# Patient Record
Sex: Female | Born: 1995 | Race: Black or African American | Hispanic: No | Marital: Single | Smoking: Never smoker
Health system: Southern US, Community
[De-identification: ages and names within clinical notes are randomized; demographics above are authoritative.]

## PROBLEM LIST (undated history)

## (undated) DIAGNOSIS — F39 Unspecified mood [affective] disorder: Secondary | ICD-10-CM

## (undated) DIAGNOSIS — D649 Anemia, unspecified: Secondary | ICD-10-CM

## (undated) HISTORY — DX: Unspecified mood (affective) disorder: F39

## (undated) HISTORY — DX: Anemia, unspecified: D64.9

## (undated) HISTORY — PX: WISDOM TOOTH EXTRACTION: SHX21

---
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
## 2013-01-01 ENCOUNTER — Ambulatory Visit: Payer: Self-pay

## 2013-01-02 ENCOUNTER — Ambulatory Visit (INDEPENDENT_AMBULATORY_CARE_PROVIDER_SITE_OTHER): Payer: Self-pay | Admitting: Family Medicine

## 2013-01-02 ENCOUNTER — Ambulatory Visit: Payer: Self-pay

## 2013-01-02 DIAGNOSIS — A749 Chlamydial infection, unspecified: Secondary | ICD-10-CM

## 2013-01-02 MED ORDER — AZITHROMYCIN 1 G PO PACK
1.0000 g | PACK | Freq: Once | ORAL | Status: AC
  Administered 2013-01-02: 1 g via ORAL

## 2013-01-02 NOTE — Progress Notes (Signed)
I was present for all necessary aspects of this encounter

## 2013-01-02 NOTE — Patient Instructions (Signed)
Pt informed to abstain from any sexual contact for the next 7 days,  to inform her partner(s) to get tested and treated and to always use condoms.Laureen Ochs, Viann Shove.

## 2013-06-26 ENCOUNTER — Telehealth: Payer: Self-pay | Admitting: *Deleted

## 2013-06-26 NOTE — Telephone Encounter (Signed)
Received a fax asking for verification of medication, exact dosage and date given for patient for treatment of positive Chlamydia or gonorrhea.  I faxed a copy of the office visit that had the date of tx, medication, route, dose and frequency, and faxed it back to [employer] [facility] department of public health.Laureen Ochs, Viann Shove

## 2013-08-04 IMAGING — US US ABDOMEN COMPLETE
1 series · 14 of 25 positions shown · non-contrast
Comparison: None.

CLINICAL DATA: Abdominal pain.  Nausea, vomiting for 3 weeks.
Epigastric pain.

COMPLETE ABDOMINAL ULTRASOUND

[Series 1: us abdomen complete · 0.24mm/px · 14 of 71 slices shown]
[im 1/71]
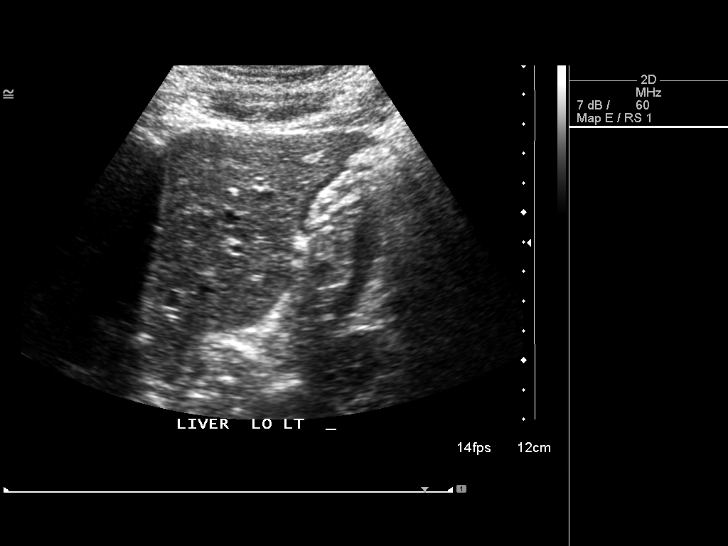
[im 6/71]
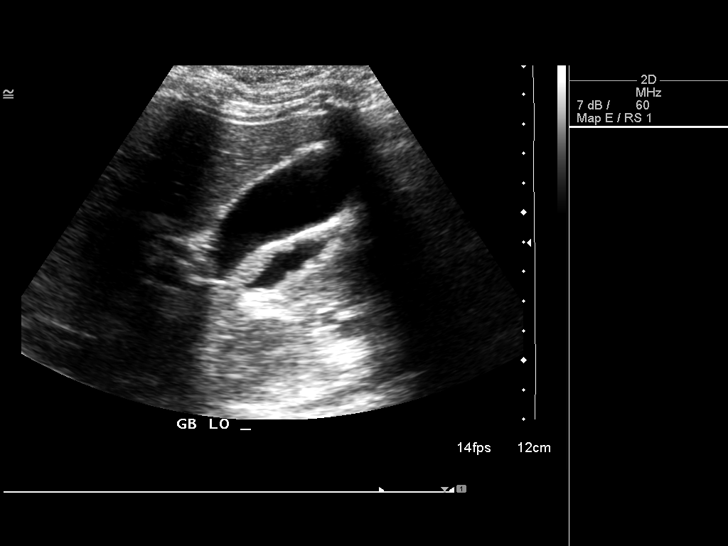
[im 12/71]
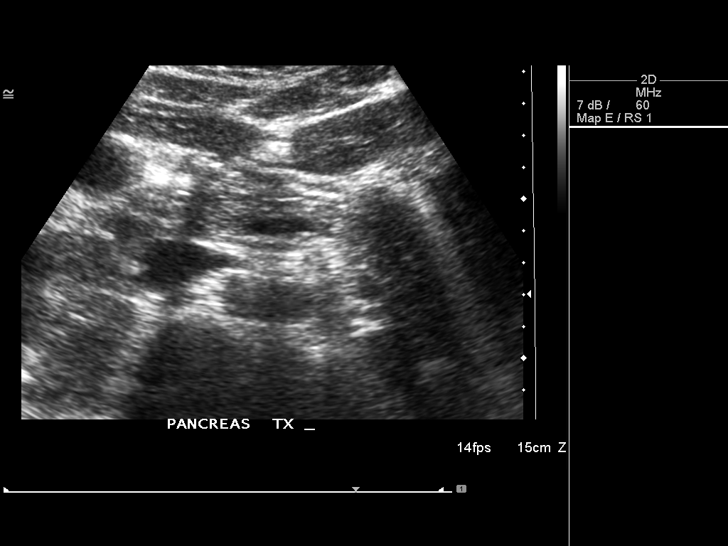
[im 18/71]
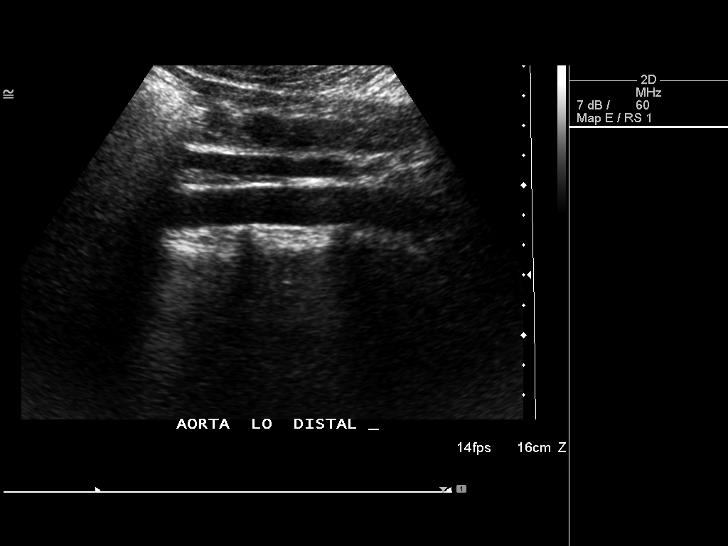
[im 24/71]
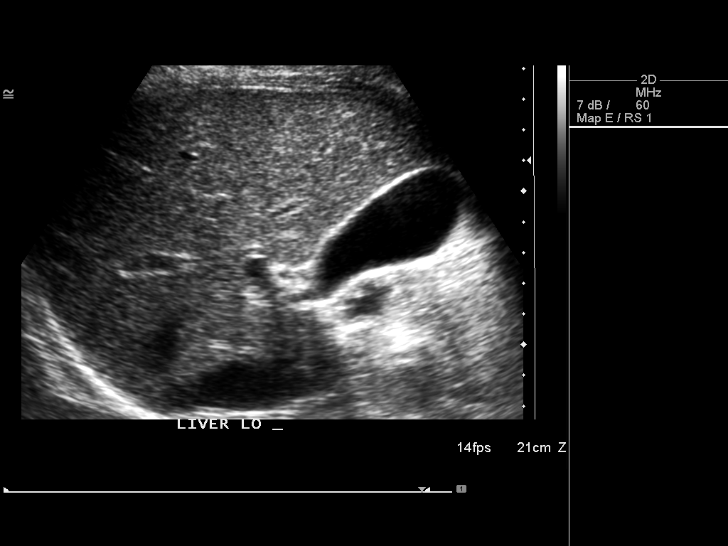
[im 27/71]
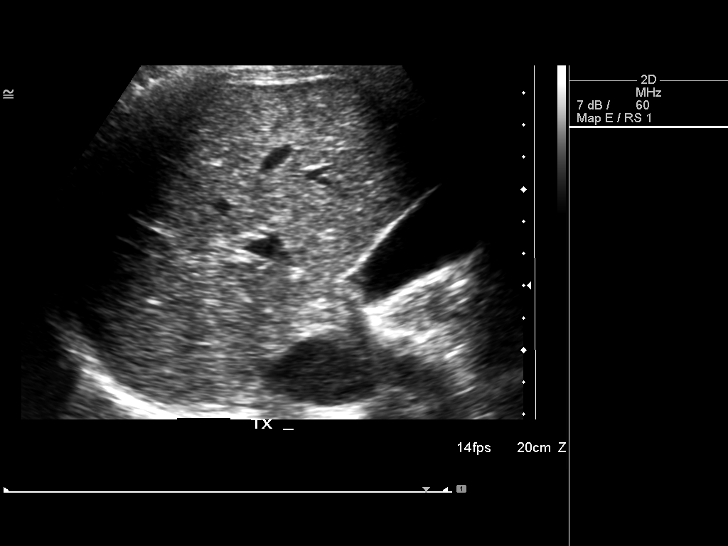
[im 33/71]
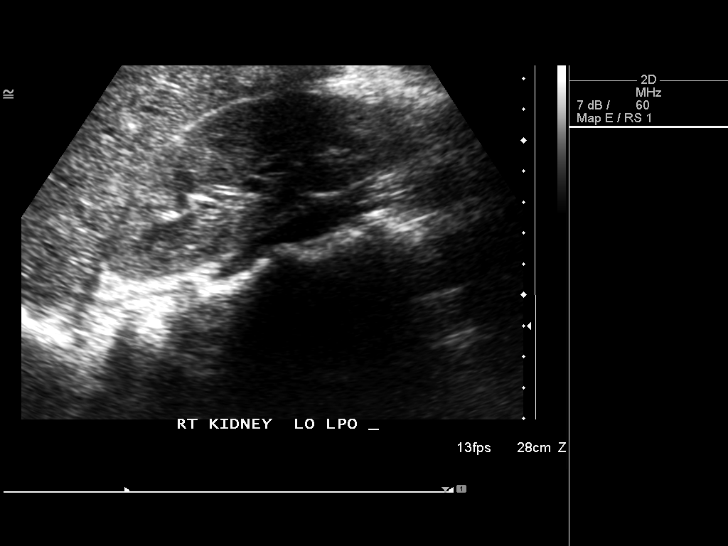
[im 38/71]
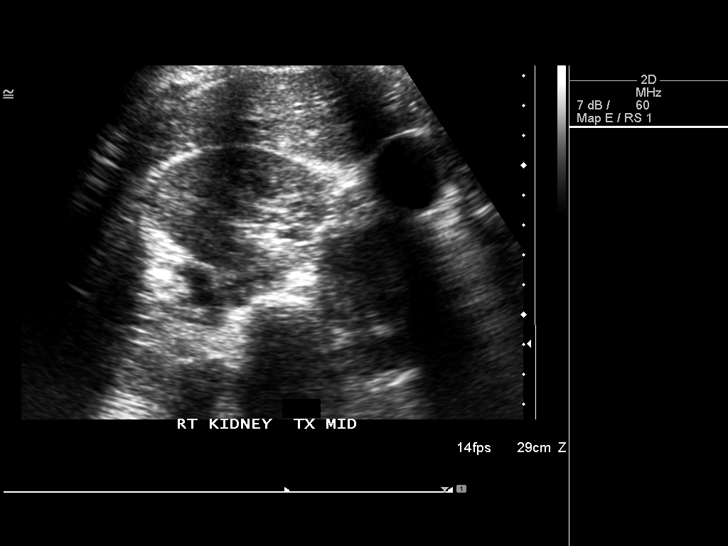
[im 44/71]
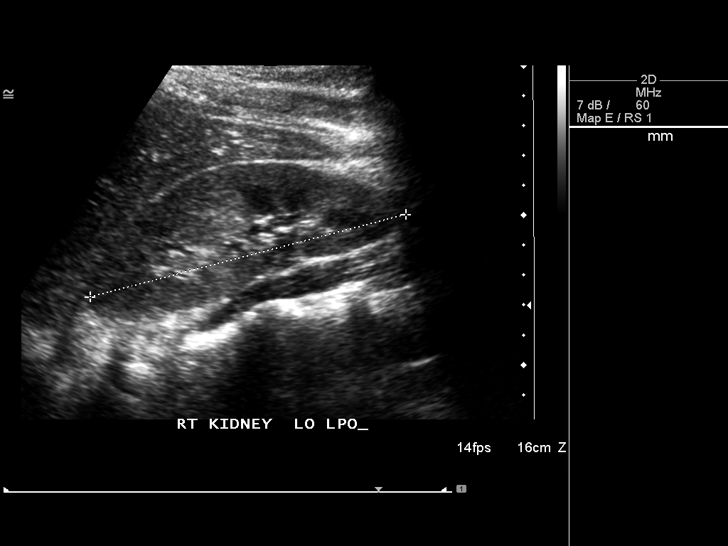
[im 47/71]
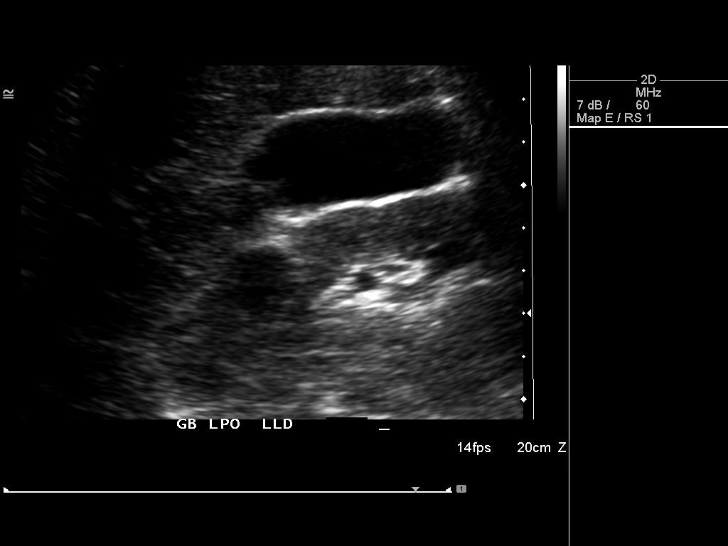
[im 53/71]
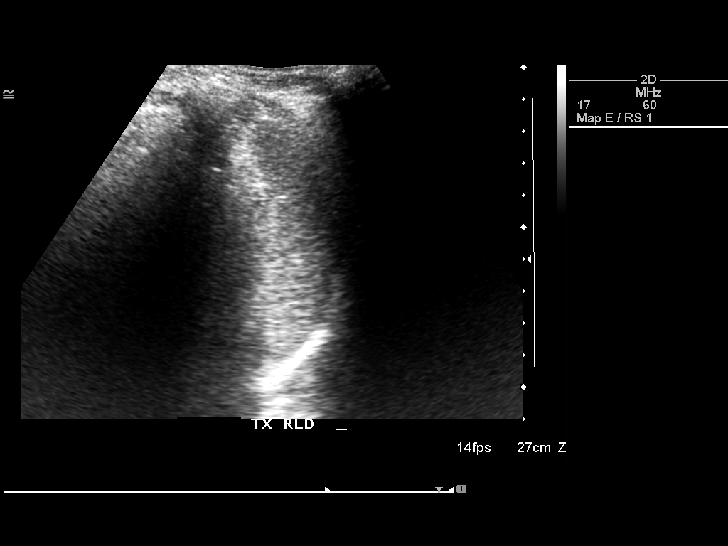
[im 59/71]
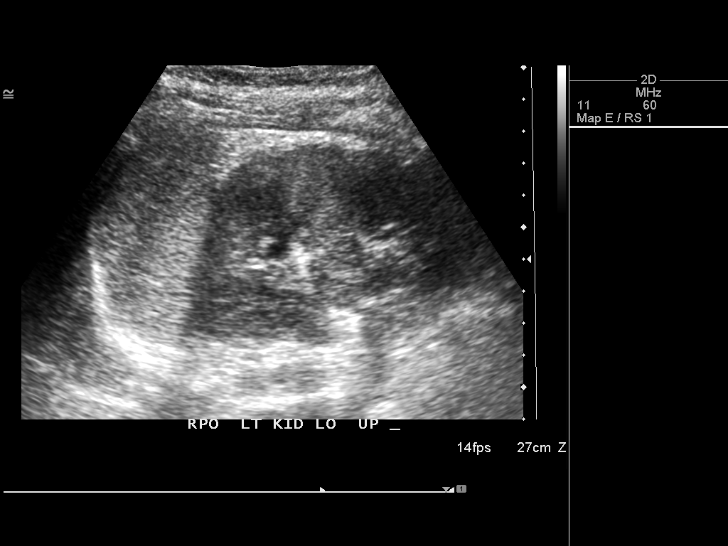
[im 65/71]
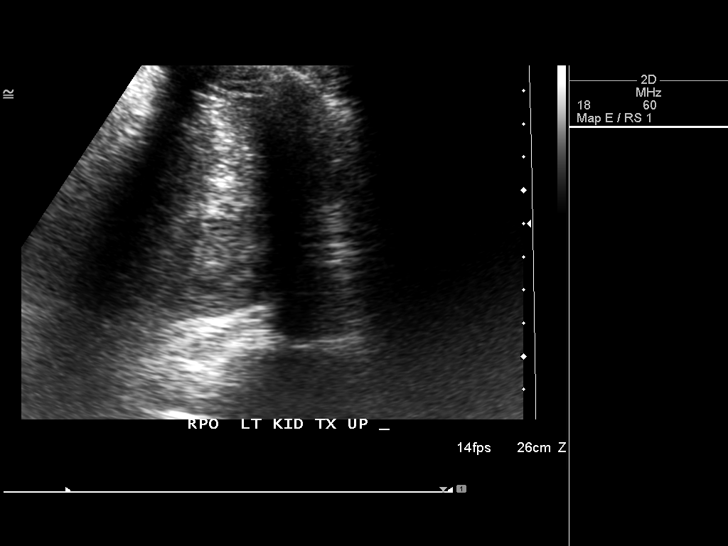
[im 71/71]
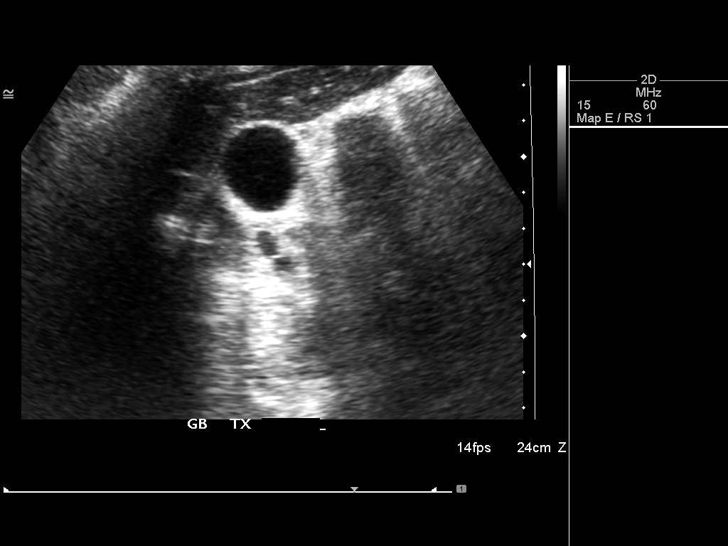

[14 of 25 positions shown; findings below may reference images not displayed]

FINDINGS: Gallbladder:  Gallbladder has a normal appearance.  Gallbladder
wall is 2.9 mm, within normal limits.  No stones or pericholecystic
fluid.  No sonographic Murphy's sign.

Common bile duct:  3.9 mm

Liver:  No focal lesion identified.  Within normal limits in
parenchymal echogenicity.

IVC:  Appears normal.

Pancreas:  Pancreas has a normal appearance.  The patient is tender
during exam of the pancreas region.

Spleen:  5.0 cm, within normal limits in appearance.

Right Kidney:  11.5 cm.  Normal appearance.

Left Kidney:  11.9 cm, normal in appearance.

Abdominal aorta:  1.8 cm.  Not aneurysmal.
IMPRESSION: 1.  No evidence for acute cholecystitis.
2.  Normal-appearing kidneys.
3.  Tenderness in the region of the pancreas during exam.

## 2014-05-31 DIAGNOSIS — Z3493 Encounter for supervision of normal pregnancy, unspecified, third trimester: Secondary | ICD-10-CM | POA: Insufficient documentation

## 2014-12-27 ENCOUNTER — Encounter: Payer: Self-pay | Admitting: Physician Assistant

## 2014-12-27 ENCOUNTER — Ambulatory Visit (INDEPENDENT_AMBULATORY_CARE_PROVIDER_SITE_OTHER): Payer: BC Managed Care – PPO | Admitting: Physician Assistant

## 2014-12-27 VITALS — BP 113/82 | HR 76 | Wt 228.0 lb

## 2014-12-27 DIAGNOSIS — R222 Localized swelling, mass and lump, trunk: Secondary | ICD-10-CM | POA: Diagnosis not present

## 2014-12-27 DIAGNOSIS — M545 Low back pain, unspecified: Secondary | ICD-10-CM

## 2014-12-27 DIAGNOSIS — R0789 Other chest pain: Secondary | ICD-10-CM

## 2014-12-27 MED ORDER — OMEPRAZOLE 40 MG PO CPDR: 40.0000 mg | DELAYED_RELEASE_CAPSULE | Freq: Every day | ORAL | Status: DC

## 2014-12-27 NOTE — Progress Notes (Signed)
   Subjective:    Patient ID: Megan Grant, female    DOB: 11-04-1995, 19 y.o.   MRN: [id number]  HPI  Pt presents to the clinic with nodule on left side of chest that is hard and occasionally will have sharp chest pains. No trigger when will happen. She does do a lot of lifting. Noticed a couple of months ago. Pain last about 5minutes and then resolves on its own. Not tried anything to make better. No cough, wheezing. Not with exertion. No arm pain, numbness or tingling.   She does have some ongoing low back pain without radiation. No saddle anthesthesia. She has tried ibuprofen with no help. She has never had evaluated. She just had baby 7 months ago. No known injury.     Review of Systems  All other systems reviewed and are negative.      Objective:   Physical Exam  Constitutional: She is oriented to person, place, and time. She appears well-developed and well-nourished.  HENT:  Head: Normocephalic and atraumatic.  Cardiovascular: Normal rate, regular rhythm and normal heart sounds.   Pulmonary/Chest: Effort normal and breath sounds normal. She has no wheezes.    Musculoskeletal:  No lumbar spinal tenderness.  Normal ROM at waist.  Negative straight leg test.  Mild tightness up left lower back.   Neurological: She is alert and oriented to person, place, and time.  Skin: Skin is dry.  Psychiatric: She has a normal mood and affect. Her behavior is normal.          Assessment & Plan:  Atypical CP/bony chest promienence- EKG NSR, no acute changes, no ST elevation or depression, no arrhthymias. Reassured patient does not seem cardiac. I feel same nodule on right side of chest. Will get CXR to evaluate. It seems the episodic pains could be musculoskelatal. Use pennsaid samples bid as needed over left chest.   Bilateral back pain without sciatica- pt has never had imaging. Will get lumbar xray.  Seems like she has some muscle strain with her job and possible some SI joint  dysfunction. Given exercises to start. Suggested 800mg  of ibuprofen regularly for next week or so then as needed. Discussed epson salt baths, cool compresses. If continues come in for full evaluation.

## 2014-12-27 NOTE — Patient Instructions (Signed)
Low Back Sprain with Rehab  A sprain is an injury in which a ligament is torn. The ligaments of the lower back are vulnerable to sprains. However, they are strong and require great force to be injured. These ligaments are important for stabilizing the spinal column. Sprains are classified into three categories. Grade 1 sprains cause pain, but the tendon is not lengthened. Grade 2 sprains include a lengthened ligament, due to the ligament being stretched or partially ruptured. With grade 2 sprains there is still function, although the function may be decreased. Grade 3 sprains involve a complete tear of the tendon or muscle, and function is usually impaired. SYMPTOMS   Severe pain in the lower back.  Sometimes, a feeling of a "pop," "snap," or tear, at the time of injury.  Tenderness and sometimes swelling at the injury site.  Uncommonly, bruising (contusion) within 48 hours of injury.  Muscle spasms in the back. CAUSES  Low back sprains occur when a force is placed on the ligaments that is greater than they can handle. Common causes of injury include:  Performing a stressful act while off-balance.  Repetitive stressful activities that involve movement of the lower back.  Direct hit (trauma) to the lower back. RISK INCREASES WITH:  Contact sports (football, wrestling).  Collisions (major skiing accidents).  Sports that require throwing or lifting (baseball, weightlifting).  Sports involving twisting of the spine (gymnastics, diving, tennis, golf).  Poor strength and flexibility.  Inadequate protection.  Previous back injury or surgery (especially fusion). PREVENTION  Wear properly fitted and padded protective equipment.  Warm up and stretch properly before activity.  Allow for adequate recovery between workouts.  Maintain physical fitness:  Strength, flexibility, and endurance.  Cardiovascular fitness.  Maintain a healthy body weight. PROGNOSIS  If treated  properly, low back sprains usually heal with non-surgical treatment. The length of time for healing depends on the severity of the injury.  RELATED COMPLICATIONS   Recurring symptoms, resulting in a chronic problem.  Chronic inflammation and pain in the low back.  Delayed healing or resolution of symptoms, especially if activity is resumed too soon.  Prolonged impairment.  Unstable or arthritic joints of the low back. TREATMENT  Treatment first involves the use of ice and medicine, to reduce pain and inflammation. The use of strengthening and stretching exercises may help reduce pain with activity. These exercises may be performed at home or with a therapist. Severe injuries may require referral to a therapist for further evaluation and treatment, such as ultrasound. Your caregiver may advise that you wear a back brace or corset, to help reduce pain and discomfort. Often, prolonged bed rest results in greater harm then benefit. Corticosteroid injections may be recommended. However, these should be reserved for the most serious cases. It is important to avoid using your back when lifting objects. At night, sleep on your back on a firm mattress, with a pillow placed under your knees. If non-surgical treatment is unsuccessful, surgery may be needed.  MEDICATION   If pain medicine is needed, nonsteroidal anti-inflammatory medicines (aspirin and ibuprofen), or other minor pain relievers (acetaminophen), are often advised.  Do not take pain medicine for 7 days before surgery.  Prescription pain relievers may be given, if your caregiver thinks they are needed. Use only as directed and only as much as you need.  Ointments applied to the skin may be helpful.  Corticosteroid injections may be given by your caregiver. These injections should be reserved for the most serious cases,   because they may only be given a certain number of times. HEAT AND COLD  Cold treatment (icing) should be applied for 10  to 15 minutes every 2 to 3 hours for inflammation and pain, and immediately after activity that aggravates your symptoms. Use ice packs or an ice massage.  Heat treatment may be used before performing stretching and strengthening activities prescribed by your caregiver, physical therapist, or athletic trainer. Use a heat pack or a warm water soak. SEEK MEDICAL CARE IF:   Symptoms get worse or do not improve in 2 to 4 weeks, despite treatment.  You develop numbness or weakness in either leg.  You lose bowel or bladder function.  Any of the following occur after surgery: fever, increased pain, swelling, redness, drainage of fluids, or bleeding in the affected area.  New, unexplained symptoms develop. (Drugs used in treatment may produce side effects.) EXERCISES  RANGE OF MOTION (ROM) AND STRETCHING EXERCISES - Low Back Sprain Most people with lower back pain will find that their symptoms get worse with excessive bending forward (flexion) or arching at the lower back (extension). The exercises that will help resolve your symptoms will focus on the opposite motion.  Your physician, physical therapist or athletic trainer will help you determine which exercises will be most helpful to resolve your lower back pain. Do not complete any exercises without first consulting with your caregiver. Discontinue any exercises which make your symptoms worse, until you speak to your caregiver. If you have pain, numbness or tingling which travels down into your buttocks, leg or foot, the goal of the therapy is for these symptoms to move closer to your back and eventually resolve. Sometimes, these leg symptoms will get better, but your lower back pain may worsen. This is often an indication of progress in your rehabilitation. Be very alert to any changes in your symptoms and the activities in which you participated in the 24 hours prior to the change. Sharing this information with your caregiver will allow him or her to  most efficiently treat your condition. These exercises may help you when beginning to rehabilitate your injury. Your symptoms may resolve with or without further involvement from your physician, physical therapist or athletic trainer. While completing these exercises, remember:   Restoring tissue flexibility helps normal motion to return to the joints. This allows healthier, less painful movement and activity.  An effective stretch should be held for at least 30 seconds.  A stretch should never be painful. You should only feel a gentle lengthening or release in the stretched tissue. FLEXION RANGE OF MOTION AND STRETCHING EXERCISES: STRETCH - Flexion, Single Knee to Chest   Lie on a firm bed or floor with both legs extended in front of you.  Keeping one leg in contact with the floor, bring your opposite knee to your chest. Hold your leg in place by either grabbing behind your thigh or at your knee.  Pull until you feel a gentle stretch in your low back. Hold __________ seconds.  Slowly release your grasp and repeat the exercise with the opposite side. Repeat __________ times. Complete this exercise __________ times per day.  STRETCH - Flexion, Double Knee to Chest  Lie on a firm bed or floor with both legs extended in front of you.  Keeping one leg in contact with the floor, bring your opposite knee to your chest.  Tense your stomach muscles to support your back and then lift your other knee to your chest. Hold your legs   in place by either grabbing behind your thighs or at your knees.  Pull both knees toward your chest until you feel a gentle stretch in your low back. Hold __________ seconds.  Tense your stomach muscles and slowly return one leg at a time to the floor. Repeat __________ times. Complete this exercise __________ times per day.  STRETCH - Low Trunk Rotation  Lie on a firm bed or floor. Keeping your legs in front of you, bend your knees so they are both pointed toward the  ceiling and your feet are flat on the floor.  Extend your arms out to the side. This will stabilize your upper body by keeping your shoulders in contact with the floor.  Gently and slowly drop both knees together to one side until you feel a gentle stretch in your low back. Hold for __________ seconds.  Tense your stomach muscles to support your lower back as you bring your knees back to the starting position. Repeat the exercise to the other side. Repeat __________ times. Complete this exercise __________ times per day  EXTENSION RANGE OF MOTION AND FLEXIBILITY EXERCISES: STRETCH - Extension, Prone on Elbows   Lie on your stomach on the floor, a bed will be too soft. Place your palms about shoulder width apart and at the height of your head.  Place your elbows under your shoulders. If this is too painful, stack pillows under your chest.  Allow your body to relax so that your hips drop lower and make contact more completely with the floor.  Hold this position for __________ seconds.  Slowly return to lying flat on the floor. Repeat __________ times. Complete this exercise __________ times per day.  RANGE OF MOTION - Extension, Prone Press Ups  Lie on your stomach on the floor, a bed will be too soft. Place your palms about shoulder width apart and at the height of your head.  Keeping your back as relaxed as possible, slowly straighten your elbows while keeping your hips on the floor. You may adjust the placement of your hands to maximize your comfort. As you gain motion, your hands will come more underneath your shoulders.  Hold this position __________ seconds.  Slowly return to lying flat on the floor. Repeat __________ times. Complete this exercise __________ times per day.  RANGE OF MOTION- Quadruped, Neutral Spine   Assume a hands and knees position on a firm surface. Keep your hands under your shoulders and your knees under your hips. You may place padding under your knees for  comfort.  Drop your head and point your tailbone toward the ground below you. This will round out your lower back like an angry cat. Hold this position for __________ seconds.  Slowly lift your head and release your tail bone so that your back sags into a large arch, like an old horse.  Hold this position for __________ seconds.  Repeat this until you feel limber in your low back.  Now, find your "sweet spot." This will be the most comfortable position somewhere between the two previous positions. This is your neutral spine. Once you have found this position, tense your stomach muscles to support your low back.  Hold this position for __________ seconds. Repeat __________ times. Complete this exercise __________ times per day.  STRENGTHENING EXERCISES - Low Back Sprain These exercises may help you when beginning to rehabilitate your injury. These exercises should be done near your "sweet spot." This is the neutral, low-back arch, somewhere between fully rounded   and fully arched, that is your least painful position. When performed in this safe range of motion, these exercises can be used for people who have either a flexion or extension based injury. These exercises may resolve your symptoms with or without further involvement from your physician, physical therapist or athletic trainer. While completing these exercises, remember:   Muscles can gain both the endurance and the strength needed for everyday activities through controlled exercises.  Complete these exercises as instructed by your physician, physical therapist or athletic trainer. Increase the resistance and repetitions only as guided.  You may experience muscle soreness or fatigue, but the pain or discomfort you are trying to eliminate should never worsen during these exercises. If this pain does worsen, stop and make certain you are following the directions exactly. If the pain is still present after adjustments, discontinue the  exercise until you can discuss the trouble with your caregiver. STRENGTHENING - Deep Abdominals, Pelvic Tilt   Lie on a firm bed or floor. Keeping your legs in front of you, bend your knees so they are both pointed toward the ceiling and your feet are flat on the floor.  Tense your lower abdominal muscles to press your low back into the floor. This motion will rotate your pelvis so that your tail bone is scooping upwards rather than pointing at your feet or into the floor. With a gentle tension and even breathing, hold this position for __________ seconds. Repeat __________ times. Complete this exercise __________ times per day.  STRENGTHENING - Abdominals, Crunches   Lie on a firm bed or floor. Keeping your legs in front of you, bend your knees so they are both pointed toward the ceiling and your feet are flat on the floor. Cross your arms over your chest.  Slightly tip your chin down without bending your neck.  Tense your abdominals and slowly lift your trunk high enough to just clear your shoulder blades. Lifting higher can put excessive stress on the lower back and does not further strengthen your abdominal muscles.  Control your return to the starting position. Repeat __________ times. Complete this exercise __________ times per day.  STRENGTHENING - Quadruped, Opposite UE/LE Lift   Assume a hands and knees position on a firm surface. Keep your hands under your shoulders and your knees under your hips. You may place padding under your knees for comfort.  Find your neutral spine and gently tense your abdominal muscles so that you can maintain this position. Your shoulders and hips should form a rectangle that is parallel with the floor and is not twisted.  Keeping your trunk steady, lift your right hand no higher than your shoulder and then your left leg no higher than your hip. Make sure you are not holding your breath. Hold this position for __________ seconds.  Continuing to keep  your abdominal muscles tense and your back steady, slowly return to your starting position. Repeat with the opposite arm and leg. Repeat __________ times. Complete this exercise __________ times per day.  STRENGTHENING - Abdominals and Quadriceps, Straight Leg Raise   Lie on a firm bed or floor with both legs extended in front of you.  Keeping one leg in contact with the floor, bend the other knee so that your foot can rest flat on the floor.  Find your neutral spine, and tense your abdominal muscles to maintain your spinal position throughout the exercise.  Slowly lift your straight leg off the floor about 6 inches for a count   of 15, making sure to not hold your breath.  Still keeping your neutral spine, slowly lower your leg all the way to the floor. Repeat this exercise with each leg __________ times. Complete this exercise __________ times per day. POSTURE AND BODY MECHANICS CONSIDERATIONS - Low Back Sprain Keeping correct posture when sitting, standing or completing your activities will reduce the stress put on different body tissues, allowing injured tissues a chance to heal and limiting painful experiences. The following are general guidelines for improved posture. Your physician or physical therapist will provide you with any instructions specific to your needs. While reading these guidelines, remember:  The exercises prescribed by your provider will help you have the flexibility and strength to maintain correct postures.  The correct posture provides the best environment for your joints to work. All of your joints have less wear and tear when properly supported by a spine with good posture. This means you will experience a healthier, less painful body.  Correct posture must be practiced with all of your activities, especially prolonged sitting and standing. Correct posture is as important when doing repetitive low-stress activities (typing) as it is when doing a single heavy-load  activity (lifting). RESTING POSITIONS Consider which positions are most painful for you when choosing a resting position. If you have pain with flexion-based activities (sitting, bending, stooping, squatting), choose a position that allows you to rest in a less flexed posture. You would want to avoid curling into a fetal position on your side. If your pain worsens with extension-based activities (prolonged standing, working overhead), avoid resting in an extended position such as sleeping on your stomach. Most people will find more comfort when they rest with their spine in a more neutral position, neither too rounded nor too arched. Lying on a non-sagging bed on your side with a pillow between your knees, or on your back with a pillow under your knees will often provide some relief. Keep in mind, being in any one position for a prolonged period of time, no matter how correct your posture, can still lead to stiffness. PROPER SITTING POSTURE In order to minimize stress and discomfort on your spine, you must sit with correct posture. Sitting with good posture should be effortless for a healthy body. Returning to good posture is a gradual process. Many people can work toward this most comfortably by using various supports until they have the flexibility and strength to maintain this posture on their own. When sitting with proper posture, your ears will fall over your shoulders and your shoulders will fall over your hips. You should use the back of the chair to support your upper back. Your lower back will be in a neutral position, just slightly arched. You may place a small pillow or folded towel at the base of your lower back for  support.  When working at a desk, create an environment that supports good, upright posture. Without extra support, muscles tire, which leads to excessive strain on joints and other tissues. Keep these recommendations in mind: CHAIR:  A chair should be able to slide under your desk  when your back makes contact with the back of the chair. This allows you to work closely.  The chair's height should allow your eyes to be level with the upper part of your monitor and your hands to be slightly lower than your elbows. BODY POSITION  Your feet should make contact with the floor. If this is not possible, use a foot rest.  Keep your   ears over your shoulders. This will reduce stress on your neck and low back. INCORRECT SITTING POSTURES  If you are feeling tired and unable to assume a healthy sitting posture, do not slouch or slump. This puts excessive strain on your back tissues, causing more damage and pain. Healthier options include:  Using more support, like a lumbar pillow.  Switching tasks to something that requires you to be upright or walking.  Talking a brief walk.  Lying down to rest in a neutral-spine position. PROLONGED STANDING WHILE SLIGHTLY LEANING FORWARD  When completing a task that requires you to lean forward while standing in one place for a long time, place either foot up on a stationary 2-4 inch high object to help maintain the best posture. When both feet are on the ground, the lower back tends to lose its slight inward curve. If this curve flattens (or becomes too large), then the back and your other joints will experience too much stress, tire more quickly, and can cause pain. CORRECT STANDING POSTURES Proper standing posture should be assumed with all daily activities, even if they only take a few moments, like when brushing your teeth. As in sitting, your ears should fall over your shoulders and your shoulders should fall over your hips. You should keep a slight tension in your abdominal muscles to brace your spine. Your tailbone should point down to the ground, not behind your body, resulting in an over-extended swayback posture.  INCORRECT STANDING POSTURES  Common incorrect standing postures include a forward head, locked knees and/or an excessive  swayback. WALKING Walk with an upright posture. Your ears, shoulders and hips should all line-up. PROLONGED ACTIVITY IN A FLEXED POSITION When completing a task that requires you to bend forward at your waist or lean over a low surface, try to find a way to stabilize 3 out of 4 of your limbs. You can place a hand or elbow on your thigh or rest a knee on the surface you are reaching across. This will provide you more stability, so that your muscles do not tire as quickly. By keeping your knees relaxed, or slightly bent, you will also reduce stress across your lower back. CORRECT LIFTING TECHNIQUES DO :  Assume a wide stance. This will provide you more stability and the opportunity to get as close as possible to the object which you are lifting.  Tense your abdominals to brace your spine. Bend at the knees and hips. Keeping your back locked in a neutral-spine position, lift using your leg muscles. Lift with your legs, keeping your back straight.  Test the weight of unknown objects before attempting to lift them.  Try to keep your elbows locked down at your sides in order get the best strength from your shoulders when carrying an object.  Always ask for help when lifting heavy or awkward objects. INCORRECT LIFTING TECHNIQUES DO NOT:   Lock your knees when lifting, even if it is a small object.  Bend and twist. Pivot at your feet or move your feet when needing to change directions.  Assume that you can safely pick up even a paperclip without proper posture. Document Released: 09/06/2005 Document Revised: 11/29/2011 Document Reviewed: 12/19/2008 [employer] Patient Information 2015 [employer]. This information is not intended to replace advice given to you by your health care provider. Make sure you discuss any questions you have with your health care provider.  

## 2014-12-31 ENCOUNTER — Encounter: Payer: Self-pay | Admitting: Physician Assistant

## 2014-12-31 DIAGNOSIS — M545 Low back pain, unspecified: Secondary | ICD-10-CM | POA: Insufficient documentation

## 2014-12-31 DIAGNOSIS — R0789 Other chest pain: Secondary | ICD-10-CM | POA: Insufficient documentation

## 2014-12-31 DIAGNOSIS — R222 Localized swelling, mass and lump, trunk: Secondary | ICD-10-CM | POA: Insufficient documentation

## 2014-12-31 MED ORDER — DICLOFENAC SODIUM 2 % TD SOLN: TRANSDERMAL | Status: DC

## 2015-01-15 ENCOUNTER — Telehealth: Payer: Self-pay | Admitting: Family Medicine

## 2015-01-15 NOTE — Telephone Encounter (Signed)
Unable to leave message, no voicemail.

## 2015-01-15 NOTE — Telephone Encounter (Signed)
Patient's mom called and advised her daughter has been trying to call and speak with a nurse in regard to her back pain and she has not been able to get a hold of anyone. Pt is still having back pain and not getting better would like to know her next step and if she can see Dr. Karie Schwalbe or be referred to a specialist. Patient request a call back from a nurse.Thanks

## 2015-01-15 NOTE — Telephone Encounter (Signed)
Tried to call pt and/or mom to adv pt needs to go get her xray regarding her back pain so we can see what the next best step would be and unable to leave vm @ [phone] and mother emergency contact number is invalid called 01/15/15 @ 1:15pm

## 2015-01-15 NOTE — Telephone Encounter (Signed)
She needs to go for the xray that was ordered and we can go from there,

## 2015-01-16 ENCOUNTER — Ambulatory Visit (INDEPENDENT_AMBULATORY_CARE_PROVIDER_SITE_OTHER): Payer: BC Managed Care – PPO

## 2015-01-16 DIAGNOSIS — M545 Low back pain, unspecified: Secondary | ICD-10-CM

## 2015-01-16 DIAGNOSIS — R0789 Other chest pain: Secondary | ICD-10-CM

## 2015-01-17 NOTE — Telephone Encounter (Signed)
Lets try to call one more time. If not then can mail letter.

## 2015-01-20 ENCOUNTER — Institutional Professional Consult (permissible substitution): Payer: Self-pay | Admitting: Sports Medicine

## 2015-01-20 NOTE — Telephone Encounter (Signed)
Spoke with Pt, advised of results. Pt has set up appt with Dr.T. No further questions.

## 2015-01-24 ENCOUNTER — Institutional Professional Consult (permissible substitution): Payer: Self-pay | Admitting: Sports Medicine

## 2015-08-25 ENCOUNTER — Ambulatory Visit (INDEPENDENT_AMBULATORY_CARE_PROVIDER_SITE_OTHER): Payer: BC Managed Care – PPO | Admitting: Family Medicine

## 2015-08-25 ENCOUNTER — Encounter: Payer: Self-pay | Admitting: Family Medicine

## 2015-08-25 VITALS — BP 117/75 | HR 75 | Temp 98.8°F | Resp 16 | Wt 240.0 lb

## 2015-08-25 DIAGNOSIS — Z3009 Encounter for other general counseling and advice on contraception: Secondary | ICD-10-CM

## 2015-08-25 NOTE — Patient Instructions (Signed)
Etonogestrel implant What is this medicine? ETONOGESTREL (et oh noe JES trel) is a contraceptive (birth control) device. It is used to prevent pregnancy. It can be used for up to 3 years. This medicine may be used for other purposes; ask your health care provider or pharmacist if you have questions. What should I tell my health care provider before I take this medicine? They need to know if you have any of these conditions: -abnormal vaginal bleeding -blood vessel disease or blood clots -cancer of the breast, cervix, or liver -depression -diabetes -gallbladder disease -headaches -heart disease or recent heart attack -high blood pressure -high cholesterol -kidney disease -liver disease -renal disease -seizures -tobacco smoker -an unusual or allergic reaction to etonogestrel, other hormones, anesthetics or antiseptics, medicines, foods, dyes, or preservatives -pregnant or trying to get pregnant -breast-feeding How should I use this medicine? This device is inserted just under the skin on the inner side of your upper arm by a health care professional. Talk to your pediatrician regarding the use of this medicine in children. Special care may be needed. Overdosage: If you think you have taken too much of this medicine contact a poison control center or emergency room at once. NOTE: This medicine is only for you. Do not share this medicine with others. What if I miss a dose? This does not apply. What may interact with this medicine? Do not take this medicine with any of the following medications: -amprenavir -bosentan -fosamprenavir This medicine may also interact with the following medications: -barbiturate medicines for inducing sleep or treating seizures -certain medicines for fungal infections like ketoconazole and itraconazole -griseofulvin -medicines to treat seizures like carbamazepine, felbamate, oxcarbazepine, phenytoin,  topiramate -modafinil -phenylbutazone -rifampin -some medicines to treat HIV infection like atazanavir, indinavir, lopinavir, nelfinavir, tipranavir, ritonavir -St. John's wort This list may not describe all possible interactions. Give your health care provider a list of all the medicines, herbs, non-prescription drugs, or dietary supplements you use. Also tell them if you smoke, drink alcohol, or use illegal drugs. Some items may interact with your medicine. What should I watch for while using this medicine? This product does not protect you against HIV infection (AIDS) or other sexually transmitted diseases. You should be able to feel the implant by pressing your fingertips over the skin where it was inserted. Contact your doctor if you cannot feel the implant, and use a non-hormonal birth control method (such as condoms) until your doctor confirms that the implant is in place. If you feel that the implant may have broken or become bent while in your arm, contact your healthcare provider. What side effects may I notice from receiving this medicine? Side effects that you should report to your doctor or health care professional as soon as possible: -allergic reactions like skin rash, itching or hives, swelling of the face, lips, or tongue -breast lumps -changes in emotions or moods -depressed mood -heavy or prolonged menstrual bleeding -pain, irritation, swelling, or bruising at the insertion site -scar at site of insertion -signs of infection at the insertion site such as fever, and skin redness, pain or discharge -signs of pregnancy -signs and symptoms of a blood clot such as breathing problems; changes in vision; chest pain; severe, sudden headache; pain, swelling, warmth in the leg; trouble speaking; sudden numbness or weakness of the face, arm or leg -signs and symptoms of liver injury like dark yellow or brown urine; general ill feeling or flu-like symptoms; light-colored stools; loss of  appetite; nausea; right upper belly   pain; unusually weak or tired; yellowing of the eyes or skin -unusual vaginal bleeding, discharge -signs and symptoms of a stroke like changes in vision; confusion; trouble speaking or understanding; severe headaches; sudden numbness or weakness of the face, arm or leg; trouble walking; dizziness; loss of balance or coordination Side effects that usually do not require medical attention (Report these to your doctor or health care professional if they continue or are bothersome.): -acne -back pain -breast pain -changes in weight -dizziness -general ill feeling or flu-like symptoms -headache -irregular menstrual bleeding -nausea -sore throat -vaginal irritation or inflammation This list may not describe all possible side effects. Call your doctor for medical advice about side effects. You may report side effects to FDA at 1-800-FDA-1088. Where should I keep my medicine? This drug is given in a hospital or clinic and will not be stored at home. NOTE: This sheet is a summary. It may not cover all possible information. If you have questions about this medicine, talk to your doctor, pharmacist, or health care provider.    2016, Elsevier/Gold Standard. (2014-06-21 14:07:06)  

## 2015-08-25 NOTE — Progress Notes (Signed)
   Subjective:    Patient ID: Megan Grant, female    DOB: 10-Jun-1996, 19 y.o.   MRN: [id number]  HPI Patient is here today to discuss birth control. She said she's been on and off the birth control pill since high school. But says last year to she's had difficulty remembering to take the pill consistently. Unfortunately she recently had an abortion. She is most interested in that Nexplanon. She has a few friends who actually habit and have spoken with them. She says she's also done some research online about it. She is not currently interested in an IUD.   Review of Systems     Objective:   Physical Exam  Constitutional: She is oriented to person, place, and time. She appears well-developed and well-nourished.  HENT:  Head: Normocephalic and atraumatic.  Eyes: Conjunctivae and EOM are normal.  Cardiovascular: Normal rate.   Pulmonary/Chest: Effort normal.  Neurological: She is alert and oriented to person, place, and time.  Skin: Skin is dry. No pallor.  Psychiatric: She has a normal mood and affect. Her behavior is normal.  Vitals reviewed.         Assessment & Plan:  Contraceptove counseling-discussed Nexplanon and other options. [facility] Recommend that she remain abstinent until she is able to have the device placed. Her questions and give her some additional information today.

## 2015-08-27 ENCOUNTER — Encounter: Payer: Self-pay | Admitting: Obstetrics & Gynecology

## 2015-08-27 ENCOUNTER — Ambulatory Visit (INDEPENDENT_AMBULATORY_CARE_PROVIDER_SITE_OTHER): Payer: BC Managed Care – PPO | Admitting: Obstetrics & Gynecology

## 2015-08-27 VITALS — BP 130/90 | HR 81 | Resp 16 | Ht 69.0 in | Wt 240.0 lb

## 2015-08-27 DIAGNOSIS — Z3201 Encounter for pregnancy test, result positive: Secondary | ICD-10-CM | POA: Diagnosis not present

## 2015-08-27 DIAGNOSIS — Z30019 Encounter for initial prescription of contraceptives, unspecified: Secondary | ICD-10-CM | POA: Diagnosis not present

## 2015-08-27 DIAGNOSIS — Z01812 Encounter for preprocedural laboratory examination: Secondary | ICD-10-CM

## 2015-08-27 LAB — POCT URINE PREGNANCY: PREG TEST UR: POSITIVE — AB

## 2015-08-27 NOTE — Progress Notes (Signed)
   Subjective:    Patient ID: Megan Grant, female    DOB: [birth year], 19 y.o.   MRN: [id number]  HPI  19 yo S AA P1 (21 yo son) here today for Nexplanon insertion. She had an abortion 08/09/15 at what she says was 8 weeks although her LMP was in August. She reports that her periods are regular. She denies sex since the abortion. She was given OCPs after the procedure but she didn't fill the prescription.  Review of Systems     Objective:   Physical Exam [id number] Breathing, conversing, and ambulating normally       Assessment & Plan:  Desire for Nexplanon but + HCG. I will get a QBHCG today and in 48 hours. If it is decreasing, then she can get the Nexplanon Monday. I stressed abstinence until Nexplanon in and working.

## 2015-08-28 LAB — HCG, QUANTITATIVE, PREGNANCY: hCG, Beta Chain, Quant, S: 168.5 m[IU]/mL — ABNORMAL HIGH

## 2015-08-29 ENCOUNTER — Other Ambulatory Visit (INDEPENDENT_AMBULATORY_CARE_PROVIDER_SITE_OTHER): Payer: BC Managed Care – PPO

## 2015-08-29 DIAGNOSIS — O3680X1 Pregnancy with inconclusive fetal viability, fetus 1: Secondary | ICD-10-CM | POA: Diagnosis not present

## 2015-08-29 DIAGNOSIS — Z01812 Encounter for preprocedural laboratory examination: Secondary | ICD-10-CM

## 2015-08-30 LAB — HCG, QUANTITATIVE, PREGNANCY: hCG, Beta Chain, Quant, S: 98.6 m[IU]/mL — ABNORMAL HIGH

## 2015-09-03 ENCOUNTER — Encounter: Payer: Self-pay | Admitting: Obstetrics & Gynecology

## 2015-09-03 ENCOUNTER — Ambulatory Visit (INDEPENDENT_AMBULATORY_CARE_PROVIDER_SITE_OTHER): Payer: BC Managed Care – PPO | Admitting: Obstetrics & Gynecology

## 2015-09-03 VITALS — BP 118/72 | HR 84 | Resp 16 | Ht 69.0 in | Wt 240.0 lb

## 2015-09-03 DIAGNOSIS — Z30017 Encounter for initial prescription of implantable subdermal contraceptive: Secondary | ICD-10-CM

## 2015-09-03 DIAGNOSIS — Z30013 Encounter for initial prescription of injectable contraceptive: Secondary | ICD-10-CM

## 2015-09-03 DIAGNOSIS — Z01812 Encounter for preprocedural laboratory examination: Secondary | ICD-10-CM

## 2015-09-03 MED ORDER — ETONOGESTREL 68 MG ~~LOC~~ IMPL
68.0000 mg | DRUG_IMPLANT | Freq: Once | SUBCUTANEOUS | Status: AC
  Administered 2015-09-03: 68 mg via SUBCUTANEOUS

## 2015-09-03 NOTE — Progress Notes (Signed)
   Subjective:    Patient ID: Megan Grant, female    DOB: 24-Feb-1996, 19 y.o.   MRN: [id number]  HPI  19 yo S AA P0 is here for Nexplanon insertion. Her QBHCG is declining (after an abortion).   Review of Systems     Objective:   Physical Exam  UPT was negative. Consent was signed. Time out procedure was done. Her left arm was prepped with betadine and infiltrated with 3 cc of 1% lidocaine. After adequate anesthesia was assured, the Nexplanon device was placed according to standard of care. Her arm was hemostatic and was bandaged. She tolerated the procedure well.       Assessment & Plan:  Contraception- Nexplanon Rec back up method for 2 weeks

## 2015-09-03 NOTE — Addendum Note (Signed)
Addended by: Arne ClevelandHUTCHINSON, MANDY J on: 09/03/2015 09:11 AM   Modules accepted: Orders

## 2015-11-13 ENCOUNTER — Ambulatory Visit (INDEPENDENT_AMBULATORY_CARE_PROVIDER_SITE_OTHER): Payer: BC Managed Care – PPO | Admitting: Family Medicine

## 2015-11-13 ENCOUNTER — Encounter: Payer: Self-pay | Admitting: Family Medicine

## 2015-11-13 VITALS — BP 125/81 | HR 88 | Ht 69.0 in | Wt 233.0 lb

## 2015-11-13 DIAGNOSIS — B379 Candidiasis, unspecified: Secondary | ICD-10-CM | POA: Diagnosis not present

## 2015-11-13 DIAGNOSIS — L298 Other pruritus: Secondary | ICD-10-CM

## 2015-11-13 DIAGNOSIS — N898 Other specified noninflammatory disorders of vagina: Secondary | ICD-10-CM

## 2015-11-13 LAB — WET PREP FOR TRICH, YEAST, CLUE
TRICH WET PREP: NONE SEEN
YEAST WET PREP: NONE SEEN

## 2015-11-13 MED ORDER — FLUCONAZOLE 150 MG PO TABS
150.0000 mg | ORAL_TABLET | Freq: Once | ORAL | Status: DC
Start: 2015-11-13 — End: 2016-08-09

## 2015-11-13 NOTE — Assessment & Plan Note (Signed)
Likely due to recent amoxicillin use.  Tx fluconazole.  Self swab wet prep pending.

## 2015-11-13 NOTE — Progress Notes (Signed)
       Megan Grant is a 20 y.o. female who presents to [facility]: Primary Care today for vaginal yeast infection. Patient notes a 4 day history of vaginal discharge and irritation. This is consistent with prior history of vaginal yeast infection. She notes that she recently completed a course of Amoxicillin. She denies any urinary frequency, urgency or dysuria. No fever, chills or NVD.    No past medical history on file. Past Surgical History  Procedure Laterality Date  . Wisdom tooth extraction     Social History  Substance Use Topics  . Smoking status: Never Smoker   . Smokeless tobacco: Never Used  . Alcohol Use: 0.0 oz/week    0 Standard drinks or equivalent per week     Comment: occassional   family history includes Diabetes in her father; Hypertension in her mother; Stroke in her father.  ROS as above Medications: Current Outpatient Prescriptions  Medication Sig Dispense Refill  . fluconazole (DIFLUCAN) 150 MG tablet Take 1 tablet (150 mg total) by mouth once. 1 tablet 1   No current facility-administered medications for this visit.   No Known Allergies   Exam:  BP 125/81 mmHg  Pulse 88  Ht  (1.753 m)  Wt 233 lb (105.688 kg)  BMI 34.39 kg/m2 Gen: Well NAD, non-toxic appearing Patient deferred exam.   No results found for this or any previous visit (from the past 24 hour(s)). No results found.   Please see individual assessment and plan sections.

## 2015-11-13 NOTE — Patient Instructions (Signed)
Thank you for coming in today. Monilial Vaginitis Vaginitis in a soreness, swelling and redness (inflammation) of the vagina and vulva. Monilial vaginitis is not a sexually transmitted infection. CAUSES  Yeast vaginitis is caused by yeast (candida) that is normally found in your vagina. With a yeast infection, the candida has overgrown in number to a point that upsets the chemical balance. SYMPTOMS   White, thick vaginal discharge.  Swelling, itching, redness and irritation of the vagina and possibly the lips of the vagina (vulva).  Burning or painful urination.  Painful intercourse. DIAGNOSIS  Things that may contribute to monilial vaginitis are:  Postmenopausal and virginal states.  Pregnancy.  Infections.  Being tired, sick or stressed, especially if you had monilial vaginitis in the past.  Diabetes. Good control will help lower the chance.  Birth control pills.  Tight fitting garments.  Using bubble bath, feminine sprays, douches or deodorant tampons.  Taking certain medications that kill germs (antibiotics).  Sporadic recurrence can occur if you become ill. TREATMENT  Your caregiver will give you medication.  There are several kinds of anti monilial vaginal creams and suppositories specific for monilial vaginitis. For recurrent yeast infections, use a suppository or cream in the vagina 2 times a week, or as directed.  Anti-monilial or steroid cream for the itching or irritation of the vulva may also be used. Get your caregiver's permission.  Painting the vagina with methylene blue solution may help if the monilial cream does not work.  Eating yogurt may help prevent monilial vaginitis. HOME CARE INSTRUCTIONS   Finish all medication as prescribed.  Do not have sex until treatment is completed or after your caregiver tells you it is okay.  Take warm sitz baths.  Do not douche.  Do not use tampons, especially scented ones.  Wear cotton underwear.  Avoid  tight pants and panty hose.  Tell your sexual partner that you have a yeast infection. They should go to their caregiver if they have symptoms such as mild rash or itching.  Your sexual partner should be treated as well if your infection is difficult to eliminate.  Practice safer sex. Use condoms.  Some vaginal medications cause latex condoms to fail. Vaginal medications that harm condoms are:  Cleocin cream.  Butoconazole (Femstat).  Terconazole (Terazol) vaginal suppository.  Miconazole (Monistat) (may be purchased over the counter). SEEK MEDICAL CARE IF:   You have a temperature by mouth above 102 F (38.9 C).  The infection is getting worse after 2 days of treatment.  The infection is not getting better after 3 days of treatment.  You develop blisters in or around your vagina.  You develop vaginal bleeding, and it is not your menstrual period.  You have pain when you urinate.  You develop intestinal problems.  You have pain with sexual intercourse.   This information is not intended to replace advice given to you by your health care provider. Make sure you discuss any questions you have with your health care provider.   Document Released: 06/16/2005 Document Revised: 11/29/2011 Document Reviewed: 03/10/2015 Elsevier Interactive Patient Education [employer]

## 2015-11-14 MED ORDER — METRONIDAZOLE 500 MG PO TABS: 500.0000 mg | ORAL_TABLET | Freq: Two times a day (BID) | ORAL | Status: DC

## 2015-11-14 NOTE — Progress Notes (Signed)
Quick Note:  Labs do not show yeast. They do show evidence of BV. Take flagyl twice daily for 1 week. Do not drink alcohol with this medicine as it will make you sick. ______

## 2015-11-14 NOTE — Addendum Note (Signed)
Addended by: Rodolph Bong on: 11/14/2015 07:42 AM   Modules accepted: Orders

## 2016-02-27 IMAGING — CR DG CHEST 2V
2 series · 2 of 2 positions shown · non-contrast
Comparison: None.

CLINICAL DATA: Intermittent mid and left-sided chest pain, also
history of intermittently palpable nodule on the left side of the
chest. The PET patient was unable localize this nodule today for
radiographic marker placement.

EXAM:
CHEST  2 VIEW

[chest pa]
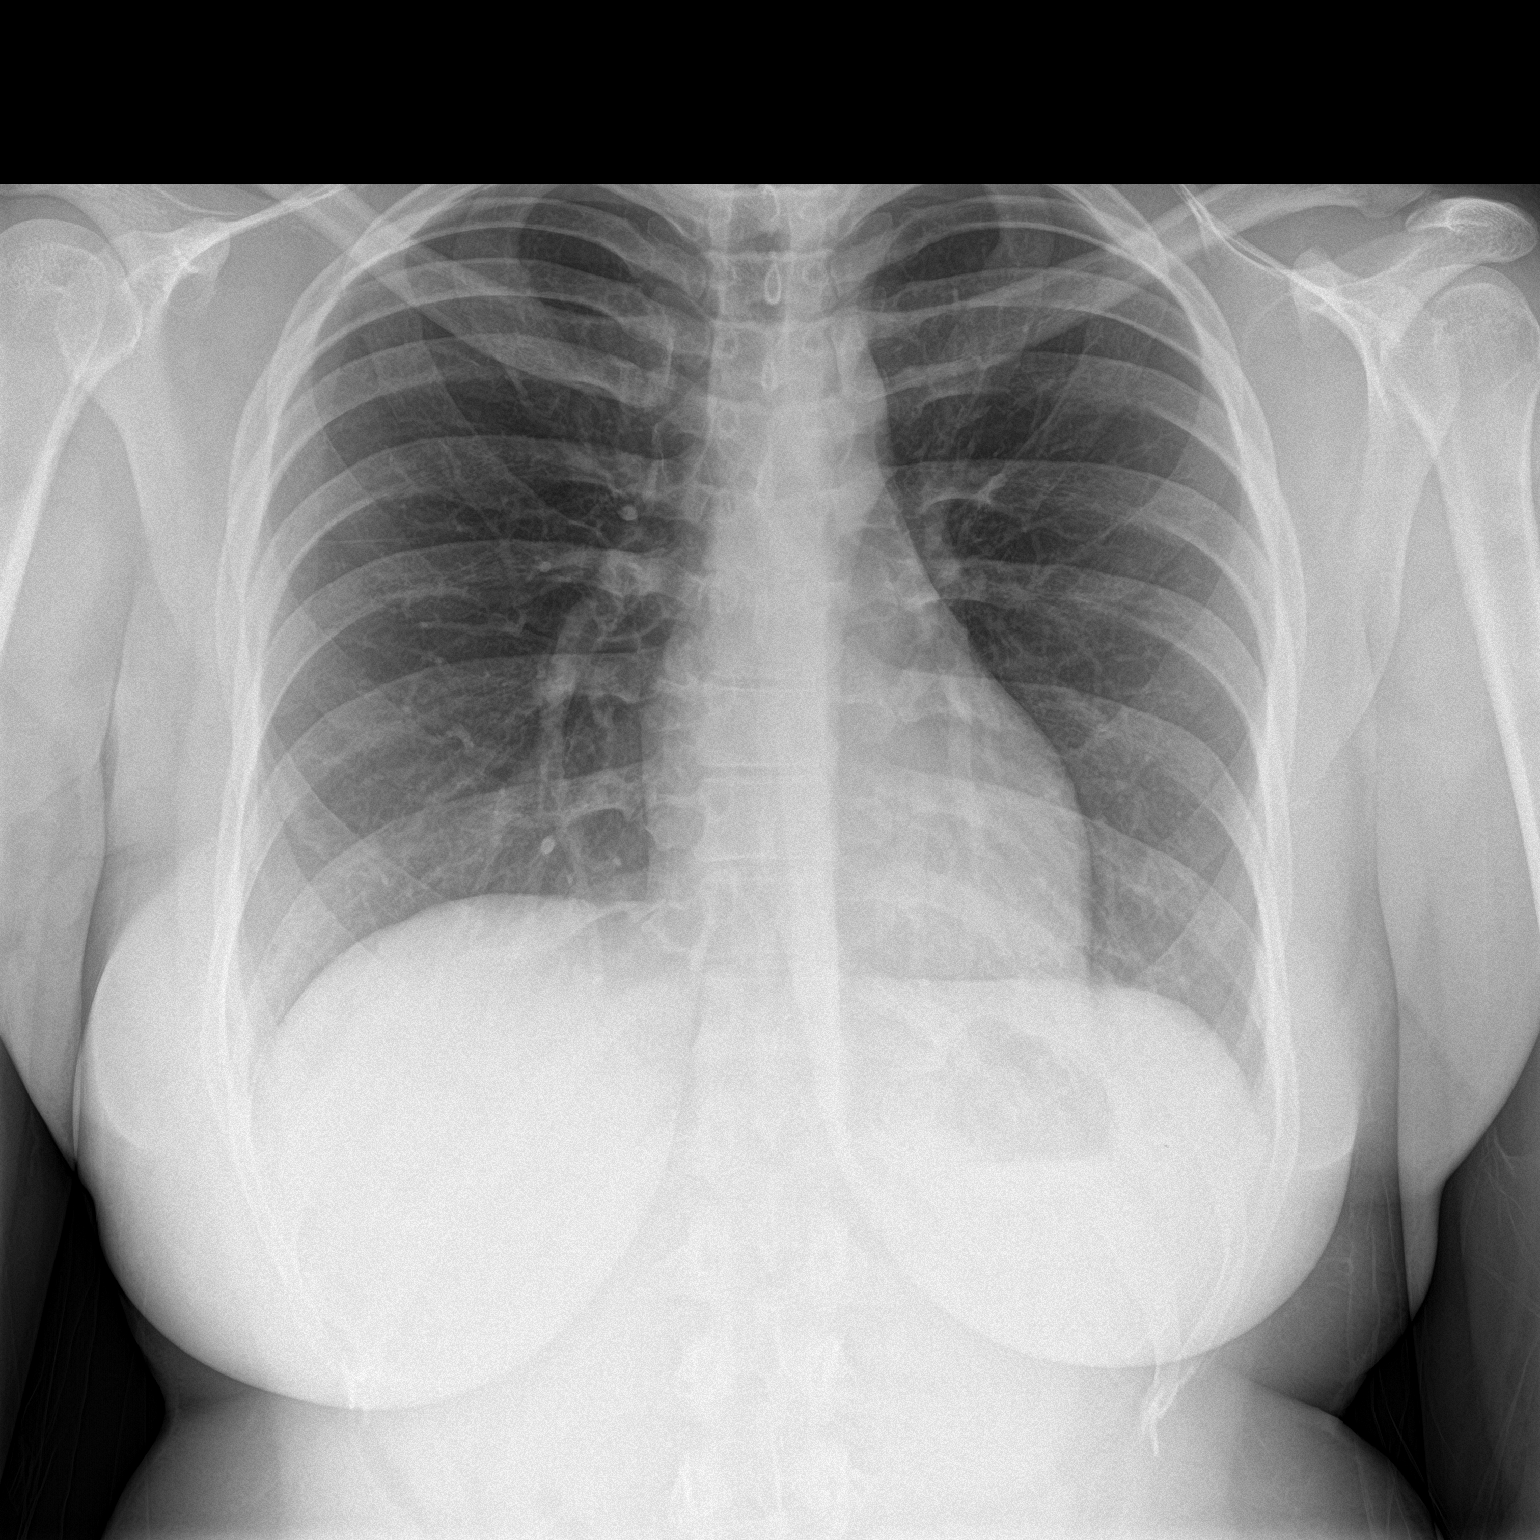

[chest lat]
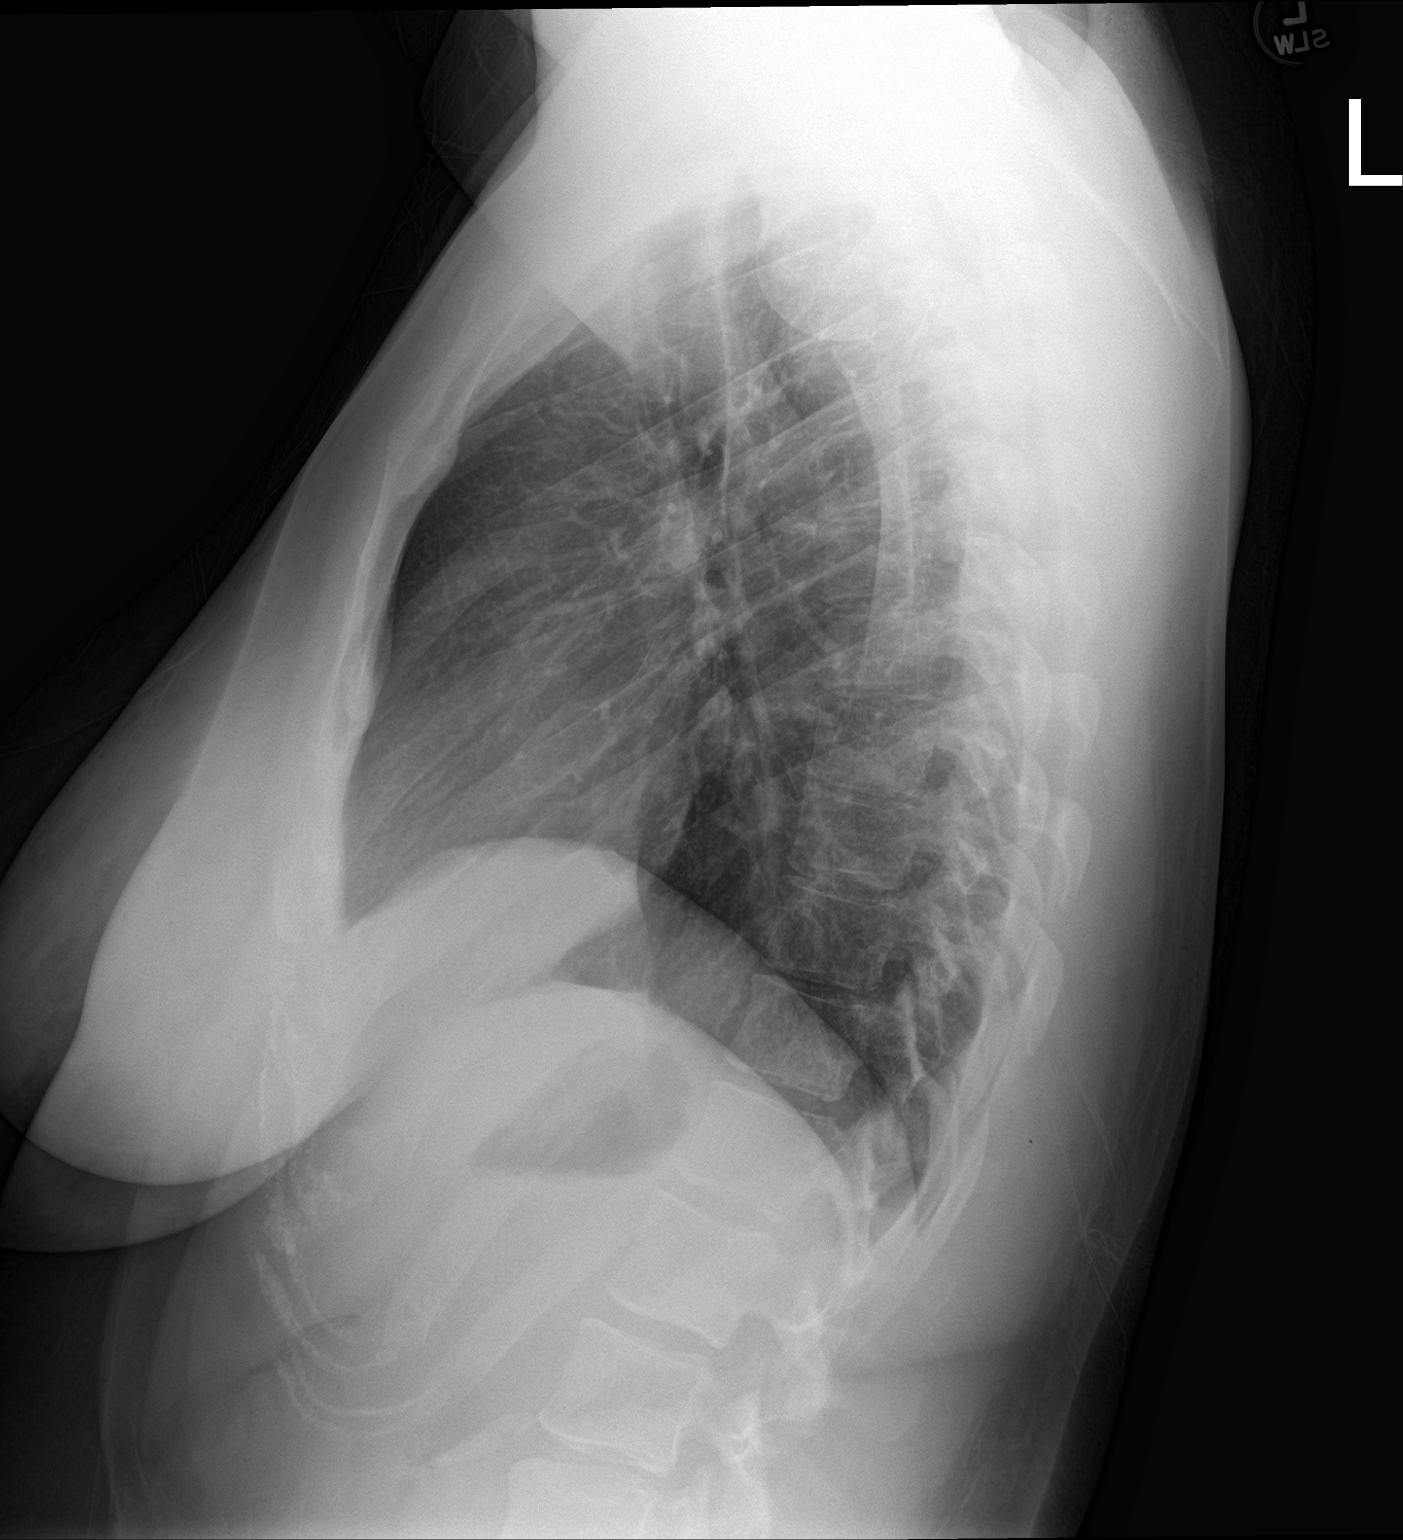

[2 of 2 positions shown; findings below may reference images not displayed]

FINDINGS: The lungs are adequately inflated and clear. The heart and pulmonary
vascularity are normal. The mediastinum is normal in width. There is
no pleural effusion. The bony thorax is unremarkable.
IMPRESSION: There is no active cardiopulmonary disease.

## 2016-08-04 ENCOUNTER — Ambulatory Visit: Payer: BC Managed Care – PPO | Admitting: Family Medicine

## 2016-08-09 ENCOUNTER — Ambulatory Visit: Payer: BC Managed Care – PPO | Admitting: Family Medicine

## 2016-08-09 NOTE — Progress Notes (Deleted)
CC: cough   Subjective:    Patient ID: Megan Grant, female    DOB: 1996-02-20, 20 y.o.   MRN: [id number]  HPI    Review of Systems     Objective:   Physical Exam  Constitutional: She is oriented to person, place, and time. She appears well-developed and well-nourished.  HENT:  Head: Normocephalic and atraumatic.  Right Ear: External ear normal.  Left Ear: External ear normal.  Nose: Nose normal.  Mouth/Throat: Oropharynx is clear and moist.  TMs and canals are clear.   Eyes: Conjunctivae and EOM are normal. Pupils are equal, round, and reactive to light.  Neck: Neck supple. No thyromegaly present.  Cardiovascular: Normal rate, regular rhythm and normal heart sounds.   Pulmonary/Chest: Effort normal and breath sounds normal. She has no wheezes.  Lymphadenopathy:    She has no cervical adenopathy.  Neurological: She is alert and oriented to person, place, and time.  Skin: Skin is warm and dry.  Psychiatric: She has a normal mood and affect.          Assessment & Plan:  Cough

## 2016-08-10 ENCOUNTER — Encounter: Payer: Self-pay | Admitting: Family Medicine

## 2016-08-10 ENCOUNTER — Ambulatory Visit (INDEPENDENT_AMBULATORY_CARE_PROVIDER_SITE_OTHER): Payer: BC Managed Care – PPO | Admitting: Family Medicine

## 2016-08-10 VITALS — BP 120/88 | HR 89 | Temp 98.0°F | Wt 238.0 lb

## 2016-08-10 DIAGNOSIS — R05 Cough: Secondary | ICD-10-CM

## 2016-08-10 DIAGNOSIS — R059 Cough, unspecified: Secondary | ICD-10-CM

## 2016-08-10 MED ORDER — BENZONATATE 200 MG PO CAPS: 200.0000 mg | ORAL_CAPSULE | Freq: Three times a day (TID) | ORAL | 0 refills | Status: DC | PRN

## 2016-08-10 MED ORDER — AZITHROMYCIN 250 MG PO TABS: 250.0000 mg | ORAL_TABLET | Freq: Every day | ORAL | 0 refills | Status: DC

## 2016-08-10 MED ORDER — GUAIFENESIN-CODEINE 100-10 MG/5ML PO SOLN: 5.0000 mL | Freq: Every evening | ORAL | 1 refills | Status: DC | PRN

## 2016-08-10 NOTE — Patient Instructions (Signed)
Thank you for coming in today. Call or go to the emergency room if you get worse, have trouble breathing, have chest pains, or palpitations.  Use tessalon during the day for cough. Use codeine over the counter as needed for cough.  Call or go to the emergency room if you get worse, have trouble breathing, have chest pains, or palpitations.  Use azithromycin antibiotic if not better or if worsening.     Acute Bronchitis, Adult Acute bronchitis is when air tubes (bronchi) in the lungs suddenly get swollen. The condition can make it hard to breathe. It can also cause these symptoms:  A cough.  Coughing up clear, yellow, or green mucus.  Wheezing.  Chest congestion.  Shortness of breath.  A fever.  Body aches.  Chills.  A sore throat. Follow these instructions at home: Medicines  Take over-the-counter and prescription medicines only as told by your doctor.  If you were prescribed an antibiotic medicine, take it as told by your doctor. Do not stop taking the antibiotic even if you start to feel better. General instructions  Rest.  Drink enough fluids to keep your pee (urine) clear or pale yellow.  Avoid smoking and secondhand smoke. If you smoke and you need help quitting, ask your doctor. Quitting will help your lungs heal faster.  Use an inhaler, cool mist vaporizer, or humidifier as told by your doctor.  Keep all follow-up visits as told by your doctor. This is important. How is this prevented? To lower your risk of getting this condition again:  Wash your hands often with soap and water. If you cannot use soap and water, use hand sanitizer.  Avoid contact with people who have cold symptoms.  Try not to touch your hands to your mouth, nose, or eyes.  Make sure to get the flu shot every year. Contact a doctor if:  Your symptoms do not get better in 2 weeks. Get help right away if:  You cough up blood.  You have chest pain.  You have very bad shortness of  breath.  You become dehydrated.  You faint (pass out) or keep feeling like you are going to pass out.  You keep throwing up (vomiting).  You have a very bad headache.  Your fever or chills gets worse. This information is not intended to replace advice given to you by your health care provider. Make sure you discuss any questions you have with your health care provider. Document Released: 02/23/2008 Document Revised: 04/14/2016 Document Reviewed: 02/25/2016 Elsevier Interactive Patient Education  2017 [employer]

## 2016-08-10 NOTE — Progress Notes (Signed)
       Mikki SanteeBianca M Ghosh is a 20 y.o. female [facility] today for cough. Patient has a 3 week history of cough. Cough is nonproductive and not associated with significant wheezing or shortness of breath or chest pain. No fevers chills vomiting or diarrhea. Cough is bothersome at night and does interfere with sleep. She has tried some over-the-counter cough medicines which did not help very much.   No past medical history on file. Past Surgical History:  Procedure Laterality Date  . WISDOM TOOTH EXTRACTION     Social History  Substance Use Topics  . Smoking status: Never Smoker  . Smokeless tobacco: Never Used  . Alcohol use 0.0 oz/week     Comment: occassional   family history includes Diabetes in her father; Hypertension in her mother; Stroke in her father.  ROS as above:  Medications: Current Outpatient Prescriptions  Medication Sig Dispense Refill  . azithromycin (ZITHROMAX) 250 MG tablet Take 1 tablet (250 mg total) by mouth daily. Take first 2 tablets together, then 1 every day until finished. 6 tablet 0  . benzonatate (TESSALON) 200 MG capsule Take 1 capsule (200 mg total) by mouth 3 (three) times daily as needed for cough. 45 capsule 0  . guaiFENesin-codeine 100-10 MG/5ML syrup Take 5 mLs by mouth at bedtime as needed for cough. 120 mL 1   No current facility-administered medications for this visit.    No Known Allergies  Health Maintenance Health Maintenance  Topic Date Due  . TETANUS/TDAP  04/19/2013  . CHLAMYDIA SCREENING  12/26/2013  . PAP SMEAR  04/20/2015  . INFLUENZA VACCINE  08/24/2016 (Originally 04/20/2016)  . HIV Screening  Completed     Exam:  BP 120/88   Pulse 89   Temp 98 F (36.7 C) (Oral)   Wt 238 lb (108 kg)   BMI 35.15 kg/m  Gen: Well NAD HEENT: EOMI,  MMM Posterior pharynx with mild cobblestoning. Lungs: Normal  work of breathing. CTABL Heart: RRR no MRG Abd: NABS, Soft. Nondistended, Nontender Exts: Brisk capillary refill, warm and well perfused.    No results found for this or any previous visit (from the past 72 hour(s)). No results found.    Assessment and Plan: 20 y.o. female with viral cough likely bronchitis. Symptomatic management with Tessalon Perles and codeine cough syrup. Use backup antibiotic if not better.  Patient will follow-up with PCP in the near future to address health maintenance items including Pap smear Chlamydia screening and Tdap vaccine.   No orders of the defined types were placed in this encounter.   Discussed warning signs or symptoms. Please see discharge instructions. Patient expresses understanding.

## 2016-08-30 ENCOUNTER — Ambulatory Visit (INDEPENDENT_AMBULATORY_CARE_PROVIDER_SITE_OTHER): Payer: BC Managed Care – PPO | Admitting: Family Medicine

## 2016-08-30 ENCOUNTER — Other Ambulatory Visit (HOSPITAL_COMMUNITY)
Admission: RE | Admit: 2016-08-30 | Discharge: 2016-08-30 | Disposition: A | Discharge status: Home / Self Care | Payer: BC Managed Care – PPO | Source: Ambulatory Visit | Attending: Family Medicine | Admitting: Family Medicine

## 2016-08-30 ENCOUNTER — Encounter: Payer: Self-pay | Admitting: Family Medicine

## 2016-08-30 VITALS — BP 126/84 | HR 75 | Ht 69.0 in | Wt 241.0 lb

## 2016-08-30 DIAGNOSIS — D649 Anemia, unspecified: Secondary | ICD-10-CM | POA: Diagnosis not present

## 2016-08-30 DIAGNOSIS — Z23 Encounter for immunization: Secondary | ICD-10-CM

## 2016-08-30 DIAGNOSIS — Z01419 Encounter for gynecological examination (general) (routine) without abnormal findings: Secondary | ICD-10-CM | POA: Insufficient documentation

## 2016-08-30 DIAGNOSIS — Z113 Encounter for screening for infections with a predominantly sexual mode of transmission: Secondary | ICD-10-CM | POA: Diagnosis present

## 2016-08-30 DIAGNOSIS — Z124 Encounter for screening for malignant neoplasm of cervix: Secondary | ICD-10-CM

## 2016-08-30 DIAGNOSIS — Z0000 Encounter for general adult medical examination without abnormal findings: Secondary | ICD-10-CM

## 2016-08-30 NOTE — Progress Notes (Signed)
Subjective:     Megan Grant is a 20 y.o. female and is here for a comprehensive physical exam. The patient reports no problems.  Social History   Social History  . Marital status: Single    Spouse name: N/A  . Number of children: N/A  . Years of education: N/A   Occupational History  . Student/Work     [employer]   Social History Main Topics  . Smoking status: Never Smoker  . Smokeless tobacco: Never Used  . Alcohol use 0.0 oz/week     Comment: occassional  . Drug use:     Frequency: 7.0 times per week    Types: Marijuana  . Sexual activity: Yes    Partners: Male   Other Topics Concern  . Not on file   Social History Narrative   No regular exercise.    Health Maintenance  Topic Date Due  . TETANUS/TDAP  04/19/2013  . CHLAMYDIA SCREENING  12/26/2013  . PAP SMEAR  04/20/2015  . INFLUENZA VACCINE  12/19/2018 (Originally 04/20/2016)  . HIV Screening  Completed    The following portions of the patient's history were reviewed and updated as appropriate: allergies, current medications, past family history, past medical history, past social history, past surgical history and problem list.  Review of Systems A comprehensive review of systems was negative.   Objective:    BP 126/84   Pulse 75   Ht 5\' 9"  (1.753 m)   Wt 241 lb (109.3 kg)   LMP 08/30/2016   SpO2 100%   BMI 35.59 kg/m  General appearance: alert, cooperative and appears stated age Head: Normocephalic, without obvious abnormality, atraumatic Eyes: conj clear, EOMi, PEERLA Ears: normal TM's and external ear canals both ears Nose: Nares normal. Septum midline. Mucosa normal. No drainage or sinus tenderness. Throat: lips, mucosa, and tongue normal; teeth and gums normal Neck: no adenopathy, no carotid bruit, no JVD, supple, symmetrical, trachea midline and thyroid not enlarged, symmetric, no tenderness/mass/nodules Back: symmetric, no curvature. ROM normal. No CVA tenderness. Lungs: clear to  auscultation bilaterally Breasts: normal appearance, no masses or tenderness Heart: regular rate and rhythm, S1, S2 normal, no murmur, click, rub or gallop Abdomen: soft, non-tender; bowel sounds normal; no masses,  no organomegaly Pelvic: cervix normal in appearance, external genitalia normal, no adnexal masses or tenderness, no cervical motion tenderness, rectovaginal septum normal, uterus normal size, shape, and consistency and vagina normal without discharge.  Piercing of clitoris.   Extremities: extremities normal, atraumatic, no cyanosis or edema Pulses: 2+ and symmetric Skin: Skin color, texture, turgor normal. No rashes or lesions Lymph nodes: Cervical, supraclavicular, and axillary nodes normal. Neurologic: Alert and oriented X 3, normal strength and tone. Normal symmetric reflexes. Normal coordination and gait    Assessment:    Healthy female exam.      Plan:  CPE   See After Visit Summary for Counseling Recommendations    Keep up a regular exercise program and make sure you are eating a healthy diet Try to eat 4 servings of dairy a day, or if you are lactose intolerant take a calcium with vitamin D daily.  Your vaccines are up to date.

## 2016-08-30 NOTE — Patient Instructions (Signed)
Keep up a regular exercise program and make sure you are eating a healthy diet Try to eat 4 servings of dairy a day, or if you are lactose intolerant take a calcium with vitamin D daily.  Your vaccines are up to date.   

## 2016-09-02 LAB — CYTOLOGY - PAP
CHLAMYDIA, DNA PROBE: NEGATIVE
Diagnosis: NEGATIVE
Neisseria Gonorrhea: NEGATIVE

## 2016-09-05 NOTE — Progress Notes (Signed)
Correction.  Normal pap. Not mammogram.  Repeat in 2-3 years.

## 2016-11-29 ENCOUNTER — Ambulatory Visit: Payer: BC Managed Care – PPO

## 2017-01-18 ENCOUNTER — Telehealth: Payer: Self-pay | Admitting: *Deleted

## 2017-01-18 ENCOUNTER — Ambulatory Visit (INDEPENDENT_AMBULATORY_CARE_PROVIDER_SITE_OTHER): Payer: BC Managed Care – PPO | Admitting: Family Medicine

## 2017-01-18 ENCOUNTER — Encounter: Payer: Self-pay | Admitting: Family Medicine

## 2017-01-18 VITALS — BP 117/72 | HR 100 | Ht 69.0 in | Wt 233.0 lb

## 2017-01-18 DIAGNOSIS — Z23 Encounter for immunization: Secondary | ICD-10-CM | POA: Diagnosis not present

## 2017-01-18 DIAGNOSIS — Z113 Encounter for screening for infections with a predominantly sexual mode of transmission: Secondary | ICD-10-CM

## 2017-01-18 LAB — WET PREP, GENITAL
CLUE CELLS WET PREP: NONE SEEN
TRICH WET PREP: NONE SEEN
YEAST WET PREP: NONE SEEN

## 2017-01-18 NOTE — Telephone Encounter (Signed)
Called pt and informed her that she does not need to come in for the next 2 gardasil immunizations. I looked her up in [facility] and she has already had the series. I told her that I would cancel her next appt. Laureen Ochs, Viann Shove

## 2017-01-18 NOTE — Progress Notes (Signed)
   Subjective:    Patient ID: Megan Grant, female    DOB: 1995/10/24, 21 y.o.   MRN: [id number]  HPI 21 year old female just found out 2 days ago that her boyfriend was cheating on her. She has been sexually active with him and is concerned about STI's. She is not currently having symptoms such as abnormal discharge pelvic pain etc. Pap smear is UTD.  She says emotionally she is doing OK.     Review of Systems     Objective:   Physical Exam  Constitutional: She is oriented to person, place, and time. She appears well-developed and well-nourished.  HENT:  Head: Normocephalic and atraumatic.  Cardiovascular: Normal rate, regular rhythm and normal heart sounds.   Pulmonary/Chest: Effort normal and breath sounds normal.  Neurological: She is alert and oriented to person, place, and time.  Skin: Skin is warm and dry.  Psychiatric: She has a normal mood and affect. Her behavior is normal.       Assessment & Plan:  Screening for STI-possible exposure-we'll check a wet prep. Laboratory urine to evaluate for gonorrhea and chlamydia. Consider RPR and HIV screening.  Does need for Gardisil vaccine. She will get her first vaccine today in follow-up in 2 months and then 6 months to finish out the series. Additional information and handout provided.

## 2017-01-18 NOTE — Patient Instructions (Addendum)
Human Papillomavirus Quadrivalent Vaccine suspension for injection What is this medicine? HUMAN PAPILLOMAVIRUS VACCINE (HYOO muhn pap uh LOH muh vahy ruhs vak SEEN) is a vaccine. It is used to prevent infections of four types of the human papillomavirus. In women, the vaccine may lower your risk of getting cervical, vaginal, vulvar, or anal cancer and genital warts. In men, the vaccine may lower your risk of getting genital warts and anal cancer. You cannot get these diseases from the vaccine. This vaccine does not treat these diseases. This medicine may be used for other purposes; ask your health care provider or pharmacist if you have questions. COMMON BRAND NAME(S): Gardasil What should I tell my health care provider before I take this medicine? They need to know if you have any of these conditions: -fever or infection -hemophilia -HIV infection or AIDS -immune system problems -low platelet count -an unusual reaction to Human Papillomavirus Vaccine, yeast, other medicines, foods, dyes, or preservatives -pregnant or trying to get pregnant -breast-feeding How should I use this medicine? This vaccine is for injection in a muscle on your upper arm or thigh. It is given by a health care professional. Dennis Bast will be observed for 15 minutes after each dose. Sometimes, fainting happens after the vaccine is given. You may be asked to sit or lie down during the 15 minutes. Three doses are given. The second dose is given 2 months after the first dose. The last dose is given 4 months after the second dose. A copy of a Vaccine Information Statement will be given before each vaccination. Read this sheet carefully each time. The sheet may change frequently. Talk to your pediatrician regarding the use of this medicine in children. While this drug may be prescribed for children as young as 11 years of age for selected conditions, precautions do apply. Overdosage: If you think you have taken too much of this  medicine contact a poison control center or emergency room at once. NOTE: This medicine is only for you. Do not share this medicine with others. What if I miss a dose? All 3 doses of the vaccine should be given within 6 months. Remember to keep appointments for follow-up doses. Your health care provider will tell you when to return for the next vaccine. Ask your health care professional for advice if you are unable to keep an appointment or miss a scheduled dose. What may interact with this medicine? -other vaccines This list may not describe all possible interactions. Give your health care provider a list of all the medicines, herbs, non-prescription drugs, or dietary supplements you use. Also tell them if you smoke, drink alcohol, or use illegal drugs. Some items may interact with your medicine. What should I watch for while using this medicine? This vaccine may not fully protect everyone. Continue to have regular pelvic exams and cervical or anal cancer screenings as directed by your doctor. The Human Papillomavirus is a sexually transmitted disease. It can be passed by any kind of sexual activity that involves genital contact. The vaccine works best when given before you have any contact with the virus. Many people who have the virus do not have any signs or symptoms. Tell your doctor or health care professional if you have any reaction or unusual symptom after getting the vaccine. What side effects may I notice from receiving this medicine? Side effects that you should report to your doctor or health care professional as soon as possible: -allergic reactions like skin rash, itching or hives, swelling  of the face, lips, or tongue -breathing problems -feeling faint or lightheaded, falls Side effects that usually do not require medical attention (report to your doctor or health care professional if they continue or are bothersome): -cough -dizziness -fever -headache -nausea -redness, warmth,  swelling, pain, or itching at site where injected This list may not describe all possible side effects. Call your doctor for medical advice about side effects. You may report side effects to FDA at 1-800-FDA-1088. Where should I keep my medicine? This drug is given in a hospital or clinic and will not be stored at home. NOTE: This sheet is a summary. It may not cover all possible information. If you have questions about this medicine, talk to your doctor, pharmacist, or health care provider.  2018 Elsevier/Gold Standard (2013-10-29 13:14:33)

## 2017-01-19 LAB — HIV ANTIBODY (ROUTINE TESTING W REFLEX): HIV: NONREACTIVE

## 2017-01-19 LAB — GC/CHLAMYDIA PROBE AMP
CT PROBE, AMP APTIMA: NOT DETECTED
GC PROBE AMP APTIMA: NOT DETECTED

## 2017-01-19 LAB — RPR

## 2017-03-18 ENCOUNTER — Ambulatory Visit: Payer: BC Managed Care – PPO | Admitting: Family Medicine

## 2017-04-29 ENCOUNTER — Ambulatory Visit: Payer: BC Managed Care – PPO | Admitting: Sports Medicine

## 2017-04-29 DIAGNOSIS — Z0189 Encounter for other specified special examinations: Secondary | ICD-10-CM

## 2017-05-26 ENCOUNTER — Ambulatory Visit (INDEPENDENT_AMBULATORY_CARE_PROVIDER_SITE_OTHER): Payer: BC Managed Care – PPO | Admitting: Osteopathic Medicine

## 2017-05-26 ENCOUNTER — Encounter: Payer: Self-pay | Admitting: Osteopathic Medicine

## 2017-05-26 VITALS — BP 121/85 | HR 112 | Ht 69.5 in | Wt 225.0 lb

## 2017-05-26 DIAGNOSIS — Z30011 Encounter for initial prescription of contraceptive pills: Secondary | ICD-10-CM | POA: Diagnosis not present

## 2017-05-26 DIAGNOSIS — Z3046 Encounter for surveillance of implantable subdermal contraceptive: Secondary | ICD-10-CM

## 2017-05-26 MED ORDER — NORGESTIMATE-ETH ESTRADIOL 0.25-35 MG-MCG PO TABS: 1.0000 | ORAL_TABLET | Freq: Every day | ORAL | 3 refills | Status: DC

## 2017-05-26 NOTE — Progress Notes (Signed)
HPI: Megan Grant is a 21 y.o. female  who [facility] today, 05/26/17,  for chief complaint of:  Chief Complaint  Patient presents with  . Other    NEXPLANON REMOVAL    Patient has been experiencing some irregular/persistent bleeding on the Nexplanon like to have it removed and switch back to birth control pills. Can recall whatever OCP she was on previously, doesn't really have any preference. Is not interested in this point in vaginal ring or IUD or patch.    Past medical history, surgical history, social history and family history reviewed.  Patient Active Problem List   Diagnosis Date Noted  . Mass of chest wall 12/31/2014  . Bilateral low back pain without sciatica 12/31/2014  . Anemia 07/09/2010  . MOOD DISORDER 03/05/2009    Current medication list and allergy/intolerance information reviewed.   No current outpatient prescriptions on file prior to visit.   No current facility-administered medications on file prior to visit.    No Known Allergies    Review of Systems:  Constitutional: No recent illness, no fever/chills  Gastrointestinal: No  abdominal pain, no change on bowel habits  Cardiovascular: No chest pain, no palpitations  Respiratory: No shortness of breath  Skin: No  Rash   Exam:  BP 121/85   Pulse (!) 112   Ht 5' 9.5" (1.765 m)   Wt 225 lb (102.1 kg)   BMI 32.75 kg/m   Constitutional: VS see above. General Appearance: alert, well-developed, well-nourished, NAD  Neck: No masses, trachea midline.   Respiratory: Normal respiratory effort.   Musculoskeletal: Gait normal.   Neurological: Normal balance/coordination. No tremor.  Skin: warm, dry, intact.   Psychiatric: Normal judgment/insight. Normal mood and affect. Oriented x3.    Nexplanon Removal Procedure Note PRE-OP DIAGNOSIS: Nexplanon, desire for change of contraception  POST-OP DIAGNOSIS: Same  PROCEDURE: Nexplanon Removal   Performing Physician:A A;exander PROCEDURE:  Anesthesia: 2% Lidocaine w/ epinephrine 5 ml  Procedure: Consent obtained. A time-out was performed prior to initiating procedure to be sure of right patient and right location. The area surrounding the Nexplanon was prepared in the usual sterile manner. The site was anesthetized with lidocaine. A skin incision was made over the distal aspect of the device. The capsule lysed sharply and the device removed using a hemostat. Hemostasis was assured. The site was dressed with SteriStrips. The patient tolerated the procedure well.  Followup: The patient tolerated the procedure well without complications. Standard post-procedure care is explained and return precautions are given. Contraception is advised until conception is desired.   ASSESSMENT/PLAN:    Encounter for Nexplanon removal  OCP (oral contraceptive pills) initiation - Plan: norgestimate-ethinyl estradiol (ORTHO-CYCLEN, 28,) 0.25-35 MG-MCG tablet     Follow-up plan: Return for recheck wound as needed - as directed. Routine care with PCP .  Visit summary with medication list and pertinent instructions was printed for patient to review, alert us if any changes needed. All questions at time of visit were answered - patient instructed to contact office with any additional concerns. ER/RTC precautions were reviewed with the patient and understanding verbalized.   Note: Total time spent non-procedure 15 minutes, greater than 50% of the visit was spent face-to-face counseling and coordinating care for the following: The primary encounter diagnosis was Encounter for Nexplanon removal. A diagnosis of OCP (oral contraceptive pills) initiation was also pertinent to this visit..Marland Kitchen

## 2017-09-15 ENCOUNTER — Encounter: Payer: Self-pay | Admitting: Physician Assistant

## 2017-09-15 ENCOUNTER — Ambulatory Visit (INDEPENDENT_AMBULATORY_CARE_PROVIDER_SITE_OTHER): Payer: BC Managed Care – PPO | Admitting: Physician Assistant

## 2017-09-15 VITALS — BP 115/82 | HR 82 | Temp 98.2°F | Resp 16 | Ht 69.0 in | Wt 226.0 lb

## 2017-09-15 DIAGNOSIS — R21 Rash and other nonspecific skin eruption: Secondary | ICD-10-CM | POA: Diagnosis not present

## 2017-09-15 MED ORDER — CICLOPIROX OLAMINE 0.77 % EX CREA: TOPICAL_CREAM | Freq: Two times a day (BID) | CUTANEOUS | 0 refills | Status: DC

## 2017-09-15 NOTE — Patient Instructions (Signed)

## 2017-09-15 NOTE — Progress Notes (Signed)
HPI:                                                                Megan SanteeBianca M Grant is a 21 y.o. female who [facility] today for rash  Patient reports migratory rash present for a month, unchanged. Reports son came home with a similar rash weeks ago. He was treated with an unknown cream by his pediatrician. Patient reports the cream didn't work. Location: extremities and trunk Duration: lesions last about 1 week Character: pruritic  Aggravating factors / Triggers: none Evolution: scaly, annular lesions, do not expand, leaves a hyperpigmented area Treatments tried: rx cream  Recent illness / systemic symptoms: none  Medication / drug exposure: none Recent travel: none Animal/insect exposure: none Sexual contacts: none History of allergies: none Exposure to new soaps, perfumes, cleaning products: none Exposure to chemicals: none   No past medical history on file. Past Surgical History:  Procedure Laterality Date  . WISDOM TOOTH EXTRACTION     Social History   Tobacco Use  . Smoking status: Never Smoker  . Smokeless tobacco: Never Used  Substance Use Topics  . Alcohol use: Yes    Alcohol/week: 0.0 oz    Comment: occassional   family history includes Diabetes in her father; Hypertension in her mother; Stroke in her father.  ROS: negative except as noted in the HPI  Medications: No current outpatient medications on file.   No current facility-administered medications for this visit.    No Known Allergies     Objective:  BP 115/82   Pulse 82   Temp 98.2 F (36.8 C)   Resp 16   Ht 5\' 9"  (1.753 m)   Wt 226 lb (102.5 kg)   SpO2 97%   BMI 33.37 kg/m  Gen:  alert, not ill-appearing, no distress, appropriate for age, obese female HEENT: head normocephalic without obvious abnormality, conjunctiva and cornea clear, trachea midline Pulm: Normal work of breathing, normal phonation  Neuro: alert and  oriented x 3, no tremor MSK: extremities atraumatic, normal gait and station Skin: approximately 1 cm annular lesion with scale on left forearm, several hyperpigmented macules on the left lower extremity  [facility] 01/18/2017  Decreased Interest 2  Down, Depressed, Hopeless 1  PHQ - 2 Score 3  Altered sleeping 1  Tired, decreased energy 2  Change in appetite 0  Feeling bad or failure about yourself  2  Trouble concentrating 1  Moving slowly or fidgety/restless 0  Suicidal thoughts 0  PHQ-9 Score 9     No results found for this or any previous visit (from the past 72 hour(s)). No results found.    Assessment and Plan: 21 y.o. female with   1. Rash and nonspecific skin eruption - differential includes infection due to tinea corporis or impetigo versus inflammatory/eczemetous disorder. Given spread between household contacts, I'm more inclined to think infectious at this point. Will treat empirically for tinea corporis and assess response in 1 week. Would be helpful to know what prescription cream patient and son have already tried - ciclopirox (LOPROX) 0.77 % cream; Apply topically 2 (two) times daily.  Dispense: 30 g; Refill: 0     Patient education and anticipatory guidance given  Patient agrees with treatment plan Follow-up as needed if symptoms worsen or fail to improve  Darlyne Russian PA-C

## 2017-10-27 ENCOUNTER — Ambulatory Visit (INDEPENDENT_AMBULATORY_CARE_PROVIDER_SITE_OTHER): Payer: BC Managed Care – PPO

## 2017-10-27 ENCOUNTER — Encounter: Payer: Self-pay | Admitting: Family Medicine

## 2017-10-27 ENCOUNTER — Ambulatory Visit (INDEPENDENT_AMBULATORY_CARE_PROVIDER_SITE_OTHER): Payer: BC Managed Care – PPO | Admitting: Family Medicine

## 2017-10-27 VITALS — BP 116/82 | HR 98 | Ht 69.0 in | Wt 217.0 lb

## 2017-10-27 DIAGNOSIS — M25572 Pain in left ankle and joints of left foot: Secondary | ICD-10-CM | POA: Diagnosis not present

## 2017-10-27 NOTE — Progress Notes (Signed)
Subjective:    Patient ID: Megan Grant, female    DOB: January 09, 1996, 22 y.o.   MRN: [id number]  HPI 23 year old female comes in today complaining of left foot swelling on the distal foot.  Says started about 2 weeks ago. Some pain on the top of her foot.  No injury trauma. When she walks the pain radiates up her calf.  She did try ibuprofen a couple times but it was not that helpful.  She has been wearing an Ace wrap on it but feels like that has not been helping either.  She usually works night shift.   Review of Systems  BP 116/82   Pulse 98   Ht 5\' 9"  (1.753 m)   Wt 217 lb (98.4 kg)   SpO2 97%   BMI 32.05 kg/m     No Known Allergies  No past medical history on file.  Past Surgical History:  Procedure Laterality Date  . WISDOM TOOTH EXTRACTION      Social History   Socioeconomic History  . Marital status: Single    Spouse name: Not on file  . Number of children: Not on file  . Years of education: Not on file  . Highest education level: Not on file  Social Needs  . Financial resource strain: Not on file  . Food insecurity - worry: Not on file  . Food insecurity - inability: Not on file  . Transportation needs - medical: Not on file  . Transportation needs - non-medical: Not on file  Occupational History  . Occupation: Student/Work    Comment: Radio broadcast assistant  Tobacco Use  . Smoking status: Never Smoker  . Smokeless tobacco: Never Used  Substance and Sexual Activity  . Alcohol use: Yes    Alcohol/week: 0.0 oz    Comment: occassional  . Drug use: Yes    Frequency: 7.0 times per week    Types: Marijuana  . Sexual activity: Yes    Partners: Male  Other Topics Concern  . Not on file  Social History Narrative   No regular exercise.     Family History  Problem Relation Age of Onset  . Diabetes Father   . Stroke Father   . Hypertension Mother     Outpatient Encounter Medications as of 10/27/2017  Medication Sig  . [DISCONTINUED] ciclopirox (LOPROX) 0.77  % cream Apply topically 2 (two) times daily.   No facility-administered encounter medications on file as of 10/27/2017.          Objective:   Physical Exam  Constitutional: She is oriented to person, place, and time. She appears well-developed and well-nourished.  HENT:  Head: Normocephalic and atraumatic.  Eyes: Conjunctivae and EOM are normal.  Cardiovascular: Normal rate.  Pulmonary/Chest: Effort normal.  Musculoskeletal:  Left ankle with normal range of motion.  She did have pain over the anterior ankle with dorsiflexion.  Nontender over the metatarsals.  No increased laxity of the ankle.  Normal range of motion and grip of the toes.  Dorsal pedal pulse and posterior tibial pulse 2+ bilaterally.  No discoloration.  She has just a little bit of swelling over the lateral part of the ankle.  No pitting.  Neurological: She is alert and oriented to person, place, and time.  Skin: Skin is dry. No pallor.  Psychiatric: She has a normal mood and affect. Her behavior is normal.  Vitals reviewed.         Assessment & Plan:  Left ankle pain - possible  ankle impingement versus sinus tarsi syndrome.  Recommendation at this point is compression.  She is Artie been trying to wear an Ace wrap and it has not been helping so give her something a little more supportive with an ASO wrap.  Recommend anti-inflammatory for about 5 days.  Work on gentle stretches.  If not improving over the next couple weeks and we will get her in with 1 of our sports medicine providers.

## 2017-10-27 NOTE — Patient Instructions (Addendum)
Recommend ibuprofen-3 tabs by mouth 3 times a day.  That is a total of 600 mg 3 times a day.  Make sure to take with food and water.  I want you to take this consistently for about 5 days to help reduce the inflammation. Try to ice the ankle 2-3 times a day if able. Wear the brace while up and walking on foot.  Work on doing the ABCs with your ankle to improve range of motion and strength. If you are not feeling better after 2-3 weeks then please give us a call and we will schedule you with 1 of our sports medicine providers.

## 2018-04-13 ENCOUNTER — Ambulatory Visit: Payer: BC Managed Care – PPO | Admitting: Family Medicine

## 2018-04-18 ENCOUNTER — Encounter: Payer: Self-pay | Admitting: Family Medicine

## 2018-04-18 ENCOUNTER — Ambulatory Visit (INDEPENDENT_AMBULATORY_CARE_PROVIDER_SITE_OTHER): Payer: BC Managed Care – PPO | Admitting: Family Medicine

## 2018-04-18 VITALS — BP 111/74 | HR 75 | Ht 69.0 in | Wt 229.0 lb

## 2018-04-18 DIAGNOSIS — L989 Disorder of the skin and subcutaneous tissue, unspecified: Secondary | ICD-10-CM

## 2018-04-18 MED ORDER — MUPIROCIN 2 % EX OINT: TOPICAL_OINTMENT | CUTANEOUS | 3 refills | Status: DC

## 2018-04-18 NOTE — Progress Notes (Addendum)
Megan Grant is a 22 y.o. female who presents to [name] today for rash.   She has had a 3 month history of red lesions that show up on her body, stay for a week or so and then resolve.  She says they are itchy but not painful. They are red but do not drain pus or blood. 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 She said her son is in daycare and is also having these same lesions and is worried he may have picked it up at daycare. She has tried a cream for it that has not helped. She does not remember what the cream is. She says she has never had chicken pox.    ROS as above:  Exam:  BP 111/74   Pulse 75   Ht 5\' 9"  (1.753 m)   Wt 229 lb (103.9 kg)   BMI 33.82 kg/m  Gen: Well NAD HEENT: EOMI,  MMM Lungs: Normal work of breathing. CTABL Heart: RRR no MRG Abd: NABS, Soft. Nondistended, Nontender Exts: Brisk capillary refill, warm and well perfused.  Skin: 1cm erythematous raised lesion with small pustules overlaying on her rightupper arm.  Scabbing on her left forearm where she says previous lesion was   Lab and Radiology Results  Swabs bacteria culture and HSV culture obtained and pending   Assessment and Plan: 22 y.o. female with rash. Likely herpes vs other viral etiology vs bacterial.  Etiology remains unclear.  She has a 3 month history of similar lesions that come and go. They are not in a dermatomal pattern suggestive of shingles. They are itchy. Today we got herpes and bacterial culture swabs. In the meantime she is to use mupirocin ointment twice daily for one week while we wait for the culture results.    Orders Placed This Encounter  Procedures  . Wound culture    Order Specific Question:   Source    Answer:   skin lesion right upper arm  . Herpes simplex virus culture    Include culture for VZV and HSV   Order Specific Question:   Source    Answer:   skin lesion right upper arm.   Meds ordered this encounter  Medications  . mupirocin ointment (BACTROBAN) 2 %    Sig: Apply to affected area BID for 7 days.    Dispense:  30 g    Refill:  3     Historical information moved to improve visibility of documentation.  Past Medical History:  Diagnosis Date  . Anemia 07/09/2010   Qualifier: Diagnosis of  By: Linford Arnold MD, Santina Evans    . Episodic mood disorder (HCC) 03/05/2009   Qualifier: Diagnosis of  By: Linford Arnold MD, Santina Evans     Past Surgical History:  Procedure Laterality Date  . WISDOM TOOTH EXTRACTION     Social History   Tobacco Use  . Smoking status: Never Smoker  . Smokeless tobacco: Never Used  Substance Use Topics  . Alcohol use: Yes    Alcohol/week: 0.0 oz    Comment: occassional   family history includes Diabetes in her father; Hypertension in her mother; Stroke in her father.  Medications: Current Outpatient Medications  Medication Sig Dispense Refill  . mupirocin ointment (BACTROBAN) 2 % Apply to affected area BID for 7 days. 30 g 3   No current facility-administered medications for this visit.    No  Known Allergies   Discussed warning signs or symptoms. Please see discharge instructions. Patient expresses understanding.   I personally was present and performed or re-performed the history, physical exam and medical decision-making activities of this service and have verified that the service and findings are accurately documented in the student's note. ___________________________________________ Clementeen GrahamEvan Corey M.D., ABFM., CAQSM. Primary Care and Sports Medicine Adjunct Instructor of Family Medicine  [employer]

## 2018-04-18 NOTE — Patient Instructions (Signed)
Thank you for coming in today. Use the antibiotic ointment twice daily for 1 week.  I will get results to you ASAP.  We are looking for herpes and bacteria.

## 2018-04-20 LAB — HERPES SIMPLEX VIRUS CULTURE
MICRO NUMBER: 90901344
SPECIMEN QUALITY:: ADEQUATE

## 2018-04-21 LAB — WOUND CULTURE
MICRO NUMBER:: 90901364
RESULT: NO GROWTH
SPECIMEN QUALITY: ADEQUATE

## 2018-05-05 ENCOUNTER — Ambulatory Visit (INDEPENDENT_AMBULATORY_CARE_PROVIDER_SITE_OTHER): Payer: BC Managed Care – PPO | Admitting: Family Medicine

## 2018-05-05 ENCOUNTER — Encounter: Payer: Self-pay | Admitting: Family Medicine

## 2018-05-05 VITALS — BP 115/86 | HR 96 | Ht 69.0 in | Wt 226.0 lb

## 2018-05-05 DIAGNOSIS — Z113 Encounter for screening for infections with a predominantly sexual mode of transmission: Secondary | ICD-10-CM

## 2018-05-05 DIAGNOSIS — Z30011 Encounter for initial prescription of contraceptive pills: Secondary | ICD-10-CM | POA: Diagnosis not present

## 2018-05-05 MED ORDER — NORGESTIMATE-ETH ESTRADIOL 0.25-35 MG-MCG PO TABS: 1.0000 | ORAL_TABLET | Freq: Every day | ORAL | 4 refills | Status: DC

## 2018-05-05 NOTE — Patient Instructions (Signed)
Oral Contraception Information Oral contraceptive pills (OCPs) are medicines taken to prevent pregnancy. OCPs work by preventing the ovaries from releasing eggs. The hormones in OCPs also cause the cervical mucus to thicken, preventing the sperm from entering the uterus. The hormones also cause the uterine lining to become thin, not allowing a fertilized egg to attach to the inside of the uterus. OCPs are highly effective when taken exactly as prescribed. However, OCPs do not prevent sexually transmitted diseases (STDs). Safe sex practices, such as using condoms along with the pill, can help prevent STDs. Before taking the pill, you may have a physical exam and Pap test. Your health care provider may order blood tests. The health care provider will make sure you are a good candidate for oral contraception. Discuss with your health care provider the possible side effects of the OCP you may be prescribed. When starting an OCP, it can take 2 to 3 months for the body to adjust to the changes in hormone levels in your body. Types of oral contraception  The combination pill-This pill contains estrogen and progestin (synthetic progesterone) hormones. The combination pill comes in 21-day, 28-day, or 91-day packs. Some types of combination pills are meant to be taken continuously (365-day pills). With 21-day packs, you do not take pills for 7 days after the last pill. With 28-day packs, the pill is taken every day. The last 7 pills are without hormones. Certain types of pills have more than 21 hormone-containing pills. With 91-day packs, the first 84 pills contain both hormones, and the last 7 pills contain no hormones or contain estrogen only.  The minipill-This pill contains the progesterone hormone only. The pill is taken every day continuously. It is very important to take the pill at the same time each day. The minipill comes in packs of 28 pills. All 28 pills contain the hormone. Advantages of oral  contraceptive pills  Decreases premenstrual symptoms.  Treats menstrual period cramps.  Regulates the menstrual cycle.  Decreases a heavy menstrual flow.  May treatacne, depending on the type of pill.  Treats abnormal uterine bleeding.  Treats polycystic ovarian syndrome.  Treats endometriosis.  Can be used as emergency contraception. Things that can make oral contraceptive pills less effective OCPs can be less effective if:  You forget to take the pill at the same time every day.  You have a stomach or intestinal disease that lessens the absorption of the pill.  You take OCPs with other medicines that make OCPs less effective, such as antibiotics, certain HIV medicines, and some seizure medicines.  You take expired OCPs.  You forget to restart the pill on day 7, when using the packs of 21 pills.  Risks associated with oral contraceptive pills Oral contraceptive pills can sometimes cause side effects, such as:  Headache.  Nausea.  Breast tenderness.  Irregular bleeding or spotting.  Combination pills are also associated with a small increased risk of:  Blood clots.  Heart attack.  Stroke.  This information is not intended to replace advice given to you by your health care provider. Make sure you discuss any questions you have with your health care provider. Document Released: 11/27/2002 Document Revised: 02/12/2016 Document Reviewed: 02/25/2013 Elsevier Interactive Patient Education  2018 Elsevier Inc.  

## 2018-05-05 NOTE — Progress Notes (Signed)
   Subjective:    Patient ID: Megan Grant, female    DOB: 12-15-1995, 22 y.o.   MRN: [id number]  HPI 22 year old female is here today to discuss options for birth control.  She is most interested in an oral contraceptive.  She has been on the pill before and was really happy with that when it would be fine going back on the same one.  She does not smoke cigarettes but does occasionally smoke marijuana.  Is not currently sexually active.   Review of Systems     Objective:   Physical Exam  Constitutional: She is oriented to person, place, and time. She appears well-developed and well-nourished.  HENT:  Head: Normocephalic and atraumatic.  Cardiovascular: Normal rate, regular rhythm and normal heart sounds.  Pulmonary/Chest: Effort normal and breath sounds normal.  Neurological: She is alert and oriented to person, place, and time.  Skin: Skin is warm and dry.  Psychiatric: She has a normal mood and affect. Her behavior is normal.          Assessment & Plan:  Contraceptive counseling-she would prefer an oral contraceptive.  Will prescribe which she has taken before and did well on.  Call if any problems side effects or concerns.  Monitor for elevated blood pressure levels.  Warned about potential for blood clots and to avoid smoking.  Follow-up in 1 year.  STD screening for chlamydia and gonorrhea performed.  Will call patient with results once available.

## 2018-05-06 LAB — C. TRACHOMATIS/N. GONORRHOEAE RNA
C. trachomatis RNA, TMA: NOT DETECTED
N. gonorrhoeae RNA, TMA: NOT DETECTED

## 2019-05-22 ENCOUNTER — Other Ambulatory Visit (HOSPITAL_COMMUNITY)
Admission: RE | Admit: 2019-05-22 | Discharge: 2019-05-22 | Disposition: A | Discharge status: Home / Self Care | Payer: BC Managed Care – PPO | Source: Ambulatory Visit | Attending: Family Medicine | Admitting: Family Medicine

## 2019-05-22 ENCOUNTER — Encounter: Payer: Self-pay | Admitting: Family Medicine

## 2019-05-22 ENCOUNTER — Ambulatory Visit (INDEPENDENT_AMBULATORY_CARE_PROVIDER_SITE_OTHER): Payer: BC Managed Care – PPO | Admitting: Family Medicine

## 2019-05-22 ENCOUNTER — Other Ambulatory Visit: Payer: Self-pay

## 2019-05-22 VITALS — BP 113/80 | HR 79 | Ht 69.0 in | Wt 233.0 lb

## 2019-05-22 DIAGNOSIS — Z6834 Body mass index (BMI) 34.0-34.9, adult: Secondary | ICD-10-CM | POA: Insufficient documentation

## 2019-05-22 DIAGNOSIS — Z30011 Encounter for initial prescription of contraceptive pills: Secondary | ICD-10-CM | POA: Diagnosis not present

## 2019-05-22 DIAGNOSIS — Z124 Encounter for screening for malignant neoplasm of cervix: Secondary | ICD-10-CM | POA: Insufficient documentation

## 2019-05-22 DIAGNOSIS — Z0000 Encounter for general adult medical examination without abnormal findings: Secondary | ICD-10-CM

## 2019-05-22 MED ORDER — NORGESTIMATE-ETH ESTRADIOL 0.25-35 MG-MCG PO TABS
1.0000 | ORAL_TABLET | Freq: Every day | ORAL | 4 refills | Status: DC
Start: 2019-05-22 — End: 2019-09-05

## 2019-05-22 NOTE — Progress Notes (Addendum)
Subjective:     Megan Grant is a 23 y.o. female and is here for a comprehensive physical exam. The patient reports no problems.  She is doing well overall feels she feels like she sleeps well.  Stress levels are fair.  She was exercising pretty regularly until COVID started.  Her goal weight is right around 180 pounds.  Social History   Socioeconomic History  . Marital status: Single    Spouse name: Not on file  . Number of children: Not on file  . Years of education: Not on file  . Highest education level: Not on file  Occupational History  . Occupation: Student/Work    Comment: [employer]  . Financial resource strain: Not on file  . Food insecurity    Worry: Not on file    Inability: Not on file  . Transportation needs    Medical: Not on file    Non-medical: Not on file  Tobacco Use  . Smoking status: Never Smoker  . Smokeless tobacco: Never Used  Substance and Sexual Activity  . Alcohol use: Yes    Alcohol/week: 0.0 standard drinks    Comment: occassional  . Drug use: Yes    Frequency: 7.0 times per week    Types: Marijuana  . Sexual activity: Yes    Partners: Male  Lifestyle  . Physical activity    Days per week: Not on file    Minutes per session: Not on file  . Stress: Not on file  Relationships  . Social Herbalist on phone: Not on file    Gets together: Not on file    Attends religious service: Not on file    Active member of club or organization: Not on file    Attends meetings of clubs or organizations: Not on file    Relationship status: Not on file  . Intimate partner violence    Fear of current or ex partner: Not on file    Emotionally abused: Not on file    Physically abused: Not on file    Forced sexual activity: Not on file  Other Topics Concern  . Not on file  Social History Narrative   No regular exercise.    Health Maintenance  Topic Date Due  . INFLUENZA VACCINE  12/19/2019 (Originally 04/21/2019)  .  PAP-Cervical Cytology Screening  08/31/2019  . PAP SMEAR-Modifier  08/31/2019  . TETANUS/TDAP  08/30/2026  . HIV Screening  Completed    The following portions of the patient's history were reviewed and updated as appropriate: allergies, current medications, past family history, past medical history, past social history, past surgical history and problem list.  Review of Systems A comprehensive review of systems was negative.   Objective:    BP 113/80   Pulse 79   Ht 5\' 9"  (1.753 m)   Wt 233 lb (105.7 kg)   LMP 04/28/2019 (Exact Date)   SpO2 100%   BMI 34.41 kg/m  General appearance: alert, cooperative and appears stated age Head: Normocephalic, without obvious abnormality, atraumatic Eyes: conj clear, EOMI, PEERLA Ears: normal TM's and external ear canals both ears Nose: Nares normal. Septum midline. Mucosa normal. No drainage or sinus tenderness. Throat: lips, mucosa, and tongue normal; teeth and gums normal Neck: no adenopathy, no carotid bruit, no JVD, supple, symmetrical, trachea midline and thyroid not enlarged, symmetric, no tenderness/mass/nodules Back: symmetric, no curvature. ROM normal. No CVA tenderness. Lungs: clear to auscultation bilaterally Breasts: normal appearance,  no masses or tenderness Heart: regular rate and rhythm, S1, S2 normal, no murmur, click, rub or gallop Abdomen: soft, non-tender; bowel sounds normal; no masses,  no organomegaly Pelvic: cervix normal in appearance, external genitalia normal, no adnexal masses or tenderness, no cervical motion tenderness, rectovaginal septum normal, uterus normal size, shape, and consistency and vagina normal without discharge Extremities: extremities normal, atraumatic, no cyanosis or edema Pulses: 2+ and symmetric Skin: Skin color, texture, turgor normal. No rashes or lesions Lymph nodes: Cervical, supraclavicular, and axillary nodes normal. Neurologic: Alert and oriented X 3, normal strength and tone. Normal  symmetric reflexes. Normal coordination and gait    Assessment:    Healthy female exam.      Plan:     See After Visit Summary for Counseling Recommendations   Keep up a regular exercise program and make sure you are eating a healthy diet Try to eat 4 servings of dairy a day, or if you are lactose intolerant take a calcium with vitamin D daily.  Your vaccines are up to date.   She would like to restart her birth control pill.  She would really like to have Nexplanon inserted and will plan to schedule that this fall but for now we will just stick with birth control.  She was forgetting to take it previously.

## 2019-05-22 NOTE — Patient Instructions (Signed)
Health Maintenance, Female Adopting a healthy lifestyle and getting preventive care are important in promoting health and wellness. Ask your health care provider about:  The right schedule for you to have regular tests and exams.  Things you can do on your own to prevent diseases and keep yourself healthy. What should I know about diet, weight, and exercise? Eat a healthy diet   Eat a diet that includes plenty of vegetables, fruits, low-fat dairy products, and lean protein.  Do not eat a lot of foods that are high in solid fats, added sugars, or sodium. Maintain a healthy weight Body mass index (BMI) is used to identify weight problems. It estimates body fat based on height and weight. Your health care provider can help determine your BMI and help you achieve or maintain a healthy weight. Get regular exercise Get regular exercise. This is one of the most important things you can do for your health. Most adults should:  Exercise for at least 150 minutes each week. The exercise should increase your heart rate and make you sweat (moderate-intensity exercise).  Do strengthening exercises at least twice a week. This is in addition to the moderate-intensity exercise.  Spend less time sitting. Even light physical activity can be beneficial. Watch cholesterol and blood lipids Have your blood tested for lipids and cholesterol at 25 years of age, then have this test every 5 years. Have your cholesterol levels checked more often if:  Your lipid or cholesterol levels are high.  You are older than 25 years of age.  You are at high risk for heart disease. What should I know about cancer screening? Depending on your health history and family history, you may need to have cancer screening at various ages. This may include screening for:  Breast cancer.  Cervical cancer.  Colorectal cancer.  Skin cancer.  Lung cancer. What should I know about heart disease, diabetes, and high blood  pressure? Blood pressure and heart disease  High blood pressure causes heart disease and increases the risk of stroke. This is more likely to develop in people who have high blood pressure readings, are of African descent, or are overweight.  Have your blood pressure checked: ? Every 3-5 years if you are 18-39 years of age. ? Every year if you are 40 years old or older. Diabetes Have regular diabetes screenings. This checks your fasting blood sugar level. Have the screening done:  Once every three years after age 40 if you are at a normal weight and have a low risk for diabetes.  More often and at a younger age if you are overweight or have a high risk for diabetes. What should I know about preventing infection? Hepatitis B If you have a higher risk for hepatitis B, you should be screened for this virus. Talk with your health care provider to find out if you are at risk for hepatitis B infection. Hepatitis C Testing is recommended for:  Everyone born from 1945 through 1965.  Anyone with known risk factors for hepatitis C. Sexually transmitted infections (STIs)  Get screened for STIs, including gonorrhea and chlamydia, if: ? You are sexually active and are younger than 24 years of age. ? You are older than 24 years of age and your health care provider tells you that you are at risk for this type of infection. ? Your sexual activity has changed since you were last screened, and you are at increased risk for chlamydia or gonorrhea. Ask your health care provider if   you are at risk.  Ask your health care provider about whether you are at high risk for HIV. Your health care provider may recommend a prescription medicine to help prevent HIV infection. If you choose to take medicine to prevent HIV, you should first get tested for HIV. You should then be tested every 3 months for as long as you are taking the medicine. Pregnancy  If you are about to stop having your period (premenopausal) and  you may become pregnant, seek counseling before you get pregnant.  Take 400 to 800 micrograms (mcg) of folic acid every day if you become pregnant.  Ask for birth control (contraception) if you want to prevent pregnancy. Osteoporosis and menopause Osteoporosis is a disease in which the bones lose minerals and strength with aging. This can result in bone fractures. If you are 65 years old or older, or if you are at risk for osteoporosis and fractures, ask your health care provider if you should:  Be screened for bone loss.  Take a calcium or vitamin D supplement to lower your risk of fractures.  Be given hormone replacement therapy (HRT) to treat symptoms of menopause. Follow these instructions at home: Lifestyle  Do not use any products that contain nicotine or tobacco, such as cigarettes, e-cigarettes, and chewing tobacco. If you need help quitting, ask your health care provider.  Do not use street drugs.  Do not share needles.  Ask your health care provider for help if you need support or information about quitting drugs. Alcohol use  Do not drink alcohol if: ? Your health care provider tells you not to drink. ? You are pregnant, may be pregnant, or are planning to become pregnant.  If you drink alcohol: ? Limit how much you use to 0-1 drink a day. ? Limit intake if you are breastfeeding.  Be aware of how much alcohol is in your drink. In the [place] one drink equals one 12 oz bottle of beer (355 mL), one 5 oz glass of wine (148 mL), or one 1 oz glass of hard liquor (44 mL). General instructions  Schedule regular health, dental, and eye exams.  Stay current with your vaccines.  Tell your health care provider if: ? You often feel depressed. ? You have ever been abused or do not feel safe at home. Summary  Adopting a healthy lifestyle and getting preventive care are important in promoting health and wellness.  Follow your health care provider's instructions about healthy  diet, exercising, and getting tested or screened for diseases.  Follow your health care provider's instructions on monitoring your cholesterol and blood pressure. This information is not intended to replace advice given to you by your health care provider. Make sure you discuss any questions you have with your health care provider. Document Released: 03/22/2011 Document Revised: 08/30/2018 Document Reviewed: 08/30/2018 Elsevier Patient Education  2020 Elsevier Inc.  

## 2019-05-23 LAB — CYTOLOGY - PAP
Chlamydia: NEGATIVE
Diagnosis: NEGATIVE
HPV: NOT DETECTED
Neisseria Gonorrhea: NEGATIVE

## 2019-05-23 LAB — CBC
HCT: 38.9 % (ref 35.0–45.0)
Hemoglobin: 12.3 g/dL (ref 11.7–15.5)
MCH: 25.8 pg — ABNORMAL LOW (ref 27.0–33.0)
MCHC: 31.6 g/dL — ABNORMAL LOW (ref 32.0–36.0)
MCV: 81.6 fL (ref 80.0–100.0)
MPV: 10.8 fL (ref 7.5–12.5)
Platelets: 296 10*3/uL (ref 140–400)
RBC: 4.77 10*6/uL (ref 3.80–5.10)
RDW: 13.2 % (ref 11.0–15.0)
WBC: 9.6 10*3/uL (ref 3.8–10.8)

## 2019-05-23 LAB — COMPLETE METABOLIC PANEL WITH GFR
AG Ratio: 1.3 (calc) (ref 1.0–2.5)
ALT: 11 U/L (ref 6–29)
AST: 13 U/L (ref 10–30)
Albumin: 3.8 g/dL (ref 3.6–5.1)
Alkaline phosphatase (APISO): 41 U/L (ref 31–125)
BUN: 9 mg/dL (ref 7–25)
CO2: 27 mmol/L (ref 20–32)
Calcium: 9.3 mg/dL (ref 8.6–10.2)
Chloride: 105 mmol/L (ref 98–110)
Creat: 0.96 mg/dL (ref 0.50–1.10)
GFR, Est African American: 95 mL/min/{1.73_m2} (ref >=60)
GFR, Est Non African American: 82 mL/min/{1.73_m2} (ref >=60)
Globulin: 2.9 g/dL (calc) (ref 1.9–3.7)
Glucose, Bld: 78 mg/dL (ref 65–99)
Potassium: 4 mmol/L (ref 3.5–5.3)
Sodium: 139 mmol/L (ref 135–146)
Total Bilirubin: 0.5 mg/dL (ref 0.2–1.2)
Total Protein: 6.7 g/dL (ref 6.1–8.1)

## 2019-05-23 LAB — LIPID PANEL
Cholesterol: 116 mg/dL (ref <200)
HDL: 48 mg/dL — ABNORMAL LOW (ref >=50)
LDL Cholesterol (Calc): 54 mg/dL (calc)
Non-HDL Cholesterol (Calc): 68 mg/dL (calc) (ref <130)
Total CHOL/HDL Ratio: 2.4 (calc) (ref <5.0)
Triglycerides: 49 mg/dL (ref <150)

## 2019-05-23 MED ORDER — FLUCONAZOLE 150 MG PO TABS: 150.0000 mg | ORAL_TABLET | Freq: Once | ORAL | 0 refills | Status: AC

## 2019-05-23 NOTE — Addendum Note (Signed)
Addended by: Beatrice Lecher D on: 05/23/2019 01:15 PM   Modules accepted: Orders

## 2019-05-24 MED ORDER — FLUCONAZOLE 150 MG PO TABS: 150.0000 mg | ORAL_TABLET | Freq: Once | ORAL | 0 refills | Status: AC

## 2019-05-24 NOTE — Addendum Note (Signed)
Addended by: Beatrice Lecher D on: 05/24/2019 12:18 PM   Modules accepted: Orders

## 2019-07-09 ENCOUNTER — Emergency Department (INDEPENDENT_AMBULATORY_CARE_PROVIDER_SITE_OTHER)
Admission: EM | Admit: 2019-07-09 | Discharge: 2019-07-09 | Disposition: A | Discharge status: Home / Self Care | Payer: BC Managed Care – PPO | Source: Home / Self Care | Attending: Emergency Medicine | Admitting: Emergency Medicine

## 2019-07-09 ENCOUNTER — Other Ambulatory Visit: Payer: Self-pay

## 2019-07-09 DIAGNOSIS — R059 Cough, unspecified: Secondary | ICD-10-CM

## 2019-07-09 DIAGNOSIS — J020 Streptococcal pharyngitis: Secondary | ICD-10-CM | POA: Diagnosis not present

## 2019-07-09 DIAGNOSIS — J040 Acute laryngitis: Secondary | ICD-10-CM

## 2019-07-09 DIAGNOSIS — J029 Acute pharyngitis, unspecified: Secondary | ICD-10-CM | POA: Diagnosis not present

## 2019-07-09 DIAGNOSIS — R05 Cough: Secondary | ICD-10-CM

## 2019-07-09 LAB — POCT RAPID STREP A (OFFICE): Rapid Strep A Screen: NEGATIVE

## 2019-07-09 MED ORDER — AZITHROMYCIN 250 MG PO TABS: ORAL_TABLET | ORAL | 0 refills | Status: DC

## 2019-07-09 NOTE — Discharge Instructions (Addendum)
Your quick strep test was negative.  We are sending off rapid strep test. We are sending off COVID-19 test, may take 3 to 5 days to get results. Diagnosis is uncertain.  Your lungs are clear on physical exam.   You likely have bacterial or viral cause for sore throat, laryngitis, cough.  To cover possible bacterial causes, I am prescribing Zithromax Z-PAK.  May use lozenges for sore throat.  Tylenol or ibuprofen if needed for pain.  OTC Robitussin if needed for cough.  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

## 2019-07-09 NOTE — ED Provider Notes (Signed)
[facility]    CSN: [id number] Arrival date & time: 07/09/19  0915      History   Chief Complaint Chief Complaint  Patient presents with  . Laryngitis  . Sore Throat  . Headache    HPI Megan Grant is a 23 y.o. female.   HPI  SORE THROAT, hoarseness, low-grade headache Onset: 3 days    Severity: moderate, but progressively worsening. Tried OTC meds without significant relief.  Symptoms:  May have had fever, not documented.  Has mild swollen neck glands. Has minimal nonproductive cough. No Recent Strep Exposure.  No known Covid exposure     No definite myalgias. Headache is mild, nonfocal No Rash  No Discolored Nasal Mucus No Allergy symptoms No sinus pain/pressure No itchy/red eyes No earache  No Drooling No Trismus  No Nausea No Vomiting No Abdominal pain No Diarrhea No Reflux symptoms  No Breathing Difficulty No Shortness of Breath No pleuritic pain No Wheezing No Hemoptysis    Rapid strep test here today is negative. Past Medical History:  Diagnosis Date  . Anemia 07/09/2010   Qualifier: Diagnosis of  By: Madilyn Fireman MD, Barnetta Chapel    . Episodic mood disorder ([place]) 03/05/2009   Qualifier: Diagnosis of  By: Madilyn Fireman MD, Catherine      Patient Active Problem List   Diagnosis Date Noted  . BMI 34.0-34.9,adult 05/22/2019  . Mass of chest wall 12/31/2014  . Bilateral low back pain without sciatica 12/31/2014  . Anemia 07/09/2010  . Episodic mood disorder ([place]) 03/05/2009    Past Surgical History:  Procedure Laterality Date  . WISDOM TOOTH EXTRACTION      OB History    Gravida  3   Para  1   Term  1   Preterm      AB  2   Living        SAB      TAB  2   Ectopic      Multiple      Live Births               Home Medications    Prior to Admission medications   Medication Sig Start Date End Date Taking? Authorizing Provider  azithromycin (ZITHROMAX Z-PAK) 250 MG tablet Take 2 tablets on day one,  then 1 tablet daily on days 2 through 5 07/09/19   Jacqulyn Cane, MD  norgestimate-ethinyl estradiol (ORTHO-CYCLEN, 28,) 0.25-35 MG-MCG tablet Take 1 tablet by mouth daily. 05/22/19   Hali Marry, MD    Family History Family History  Problem Relation Age of Onset  . Hypertension Mother   . Diabetes Father   . Stroke Father     Social History Social History   Tobacco Use  . Smoking status: Never Smoker  . Smokeless tobacco: Never Used  Substance Use Topics  . Alcohol use: Yes    Alcohol/week: 0.0 standard drinks    Comment: occassional  . Drug use: Yes    Frequency: 7.0 times per week    Types: Marijuana     Allergies   Patient has no known allergies.   Review of Systems Review of Systems  All other systems reviewed and are negative.    Physical Exam Triage Vital Signs ED Triage Vitals  Enc Vitals Group     BP 07/09/19 0941 122/86     Pulse Rate 07/09/19 0941 91     Resp 07/09/19 0941 20     Temp 07/09/19 0941 (!) 97.3 F (  36.3 C)     Temp Source 07/09/19 0941 Temporal     SpO2 07/09/19 0941 100 %     Weight 07/09/19 0942 252 lb (114.3 kg)     Height 07/09/19 0942 5\' 9"  (1.753 m)     Head Circumference --      Peak Flow --      Pain Score 07/09/19 0941 7     Pain Loc --      Pain Edu? --      Excl. in GC? --    No data found.  Updated Vital Signs BP 122/86 (BP Location: Right Arm)   Pulse 91   Temp (!) 97.3 F (36.3 C) (Temporal)   Resp 20   Ht 5\' 9"  (1.753 m)   Wt 114.3 kg   LMP 06/21/2019   SpO2 100%   BMI 37.21 kg/m   Visual Acuity Right Eye Distance:   Left Eye Distance:   Bilateral Distance:    Right Eye Near:   Left Eye Near:    Bilateral Near:     Physical Exam Vitals signs and nursing note reviewed.  Constitutional:      General: She is not in acute distress.    Appearance: She is well-developed. She is not toxic-appearing.     Comments: Appears mildly fatigued, but no acute distress  HENT:     Head: Normocephalic  and atraumatic.     Right Ear: Tympanic membrane, ear canal and external ear normal.     Left Ear: Tympanic membrane, ear canal and external ear normal.     Nose: Nose normal. No congestion or rhinorrhea.     Right Sinus: No maxillary sinus tenderness or frontal sinus tenderness.     Left Sinus: No maxillary sinus tenderness or frontal sinus tenderness.     Mouth/Throat:     Mouth: Mucous membranes are moist. No oral lesions.     Pharynx: Posterior oropharyngeal erythema present. No pharyngeal swelling or oropharyngeal exudate.     Comments: Posterior pharynx minimally red bilaterally, tonsils mildly large, red, without exudate.  Airway intact. Voice is very hoarse Eyes:     General: No scleral icterus.    Conjunctiva/sclera: Conjunctivae normal.  Neck:     Musculoskeletal: Neck supple.  Cardiovascular:     Rate and Rhythm: Normal rate and regular rhythm.     Heart sounds: Normal heart sounds. No murmur.  Pulmonary:     Effort: Pulmonary effort is normal. No respiratory distress.     Breath sounds: Normal breath sounds. No stridor. No wheezing, rhonchi or rales.  Abdominal:     General: There is no distension.     Palpations: Abdomen is soft.     Tenderness: There is no abdominal tenderness.  Lymphadenopathy:     Cervical: Cervical adenopathy present.     Right cervical: Superficial cervical adenopathy present. No deep or posterior cervical adenopathy.    Left cervical: Superficial cervical adenopathy present. No deep or posterior cervical adenopathy.  Skin:    General: Skin is warm and dry.     Capillary Refill: Capillary refill takes less than 2 seconds.     Findings: No rash.  Neurological:     General: No focal deficit present.     Mental Status: She is alert and oriented to person, place, and time.  Psychiatric:        Mood and Affect: Mood normal.        Behavior: Behavior is cooperative.  UC Treatments / Results  Labs (all labs ordered are listed, but only  abnormal results are displayed) Labs Reviewed  STREP A DNA PROBE  SARS-COV-2 RNA, QUALITATIVE REAL-TIME RT-PCR  POCT RAPID STREP A (OFFICE)    EKG   Radiology No results found.  Procedures Procedures (including critical care time)  Medications Ordered in UC Medications - No data to display  Initial Impression / Assessment and Plan / UC Course  I have reviewed the triage vital signs and the nursing notes.  Pertinent labs & imaging results that were available during my care of the patient were reviewed by me and considered in my medical decision making (see chart for details).      Final Clinical Impressions(s) / UC Diagnoses     Sore throat  Acute laryngitis  Cough       Discharge Instructions     Your quick strep test was negative.  We are sending off rapid strep test. We are sending off COVID-19 test, may take 3 to 5 days to get results. Diagnosis is uncertain.  Your lungs are clear on physical exam.   You likely have bacterial or viral cause for sore throat, laryngitis, cough.  To cover possible bacterial causes, I am prescribing Zithromax Z-PAK.  May use lozenges for sore throat.  Tylenol or ibuprofen if needed for pain.  OTC Robitussin if needed for cough.  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    ED Prescriptions    Medication Sig Dispense Auth. Provider   azithromycin (ZITHROMAX Z-PAK) 250 MG tablet Take 2 tablets on day one, then 1 tablet daily on days 2 through 5 1 each Lajean Manes, MD     PDMP not reviewed this encounter.   Lajean Manes, MD 07/11/19 1353

## 2019-07-09 NOTE — ED Triage Notes (Signed)
Started yesterday with laryngitis, sore throat and headache.

## 2019-07-11 LAB — STREP A DNA PROBE: Group A Strep Probe: NOT DETECTED

## 2019-07-13 LAB — SARS-COV-2 RNA,(COVID-19) QUALITATIVE NAAT: SARS CoV2 RNA: NOT DETECTED

## 2019-07-26 ENCOUNTER — Emergency Department (INDEPENDENT_AMBULATORY_CARE_PROVIDER_SITE_OTHER)
Admission: EM | Admit: 2019-07-26 | Discharge: 2019-07-26 | Disposition: A | Discharge status: Home / Self Care | Payer: BC Managed Care – PPO | Source: Home / Self Care

## 2019-07-26 ENCOUNTER — Encounter: Payer: Self-pay | Admitting: *Deleted

## 2019-07-26 ENCOUNTER — Other Ambulatory Visit: Payer: Self-pay

## 2019-07-26 DIAGNOSIS — N926 Irregular menstruation, unspecified: Secondary | ICD-10-CM

## 2019-07-26 LAB — POCT URINE PREGNANCY: Preg Test, Ur: NEGATIVE

## 2019-07-26 NOTE — ED Triage Notes (Signed)
Pt reports that her period is late. She has taken multiple pregnancy test some positive and some negative. She reports that her period started yesterday. Reports some cramping.

## 2019-07-26 NOTE — ED Provider Notes (Signed)
[facility]    CSN: [id number] Arrival date & time: 07/26/19  1714      History   Chief Complaint Chief Complaint  Patient presents with   Possible Pregnancy    HPI Megan Grant is a 23 y.o. female.   HPI  Megan Grant is a 23 y.o. female presenting to UC with concern for possible pregnancy. She is on OCPs but states her period was suppose to come on/around Oct 28th and did not come until yesterday, 07/26/2019.  She has taken a few at home urine pregnancy tests but some have been positive and some have been negative. She has had mild lower abdominal cramping c/w menstrual cramps. Denies pain at this time. No fever, chills, n/v/d. No urinary symptoms. No concern for STIs.  No recent change in her birth control. She usually has regular cycles. She has been under increased stress recently.     Past Medical History:  Diagnosis Date   Anemia 07/09/2010   Qualifier: Diagnosis of  By: Linford Arnold MD, Catherine     Episodic mood disorder ([facility]) 03/05/2009   Qualifier: Diagnosis of  By: Linford Arnold MD, Catherine      Patient Active Problem List   Diagnosis Date Noted   BMI 34.0-34.9,adult 05/22/2019   Mass of chest wall 12/31/2014   Bilateral low back pain without sciatica 12/31/2014   Anemia 07/09/2010   Episodic mood disorder ([facility]) 03/05/2009    Past Surgical History:  Procedure Laterality Date   WISDOM TOOTH EXTRACTION      OB History    Gravida  3   Para  1   Term  1   Preterm      AB  2   Living        SAB      TAB  2   Ectopic      Multiple      Live Births               Home Medications    Prior to Admission medications   Medication Sig Start Date End Date Taking? Authorizing Provider  norgestimate-ethinyl estradiol (ORTHO-CYCLEN, 28,) 0.25-35 MG-MCG tablet Take 1 tablet by mouth daily. 05/22/19   Agapito Games, MD    Family History Family History  Problem Relation Age of Onset   Hypertension Mother     Diabetes Father    Stroke Father     Social History Social History   Tobacco Use   Smoking status: Never Smoker   Smokeless tobacco: Never Used  Substance Use Topics   Alcohol use: Yes    Alcohol/week: 0.0 standard drinks    Comment: occassional   Drug use: Yes    Frequency: 7.0 times per week    Types: Marijuana     Allergies   Patient has no known allergies.   Review of Systems Review of Systems  Constitutional: Negative for chills and fever.  Gastrointestinal: Positive for abdominal pain (mild lower cramping). Negative for diarrhea, nausea and vomiting.  Genitourinary: Positive for menstrual problem (late) and vaginal bleeding. Negative for decreased urine volume, dysuria, flank pain, frequency, hematuria, pelvic pain, urgency, vaginal discharge and vaginal pain.  Musculoskeletal: Negative for back pain.     Physical Exam Triage Vital Signs ED Triage Vitals [07/26/19 1756]  Enc Vitals Group     BP (!) 143/98     Pulse Rate 69     Resp 16     Temp 98.8 F (37.1 C)  Temp Source Oral     SpO2 99 %     Weight 246 lb (111.6 kg)     Height 5\' 9"  (1.753 m)     Head Circumference      Peak Flow      Pain Score 0     Pain Loc      Pain Edu?      Excl. in [place]?    No data found.  Updated Vital Signs BP (!) 143/98 (BP Location: Right Arm)    Pulse 69    Temp 98.8 F (37.1 C) (Oral)    Resp 16    Ht 5\' 9"  (1.753 m)    Wt 246 lb (111.6 kg)    LMP 07/25/2019    SpO2 99%    BMI 36.33 kg/m   Visual Acuity Right Eye Distance:   Left Eye Distance:   Bilateral Distance:    Right Eye Near:   Left Eye Near:    Bilateral Near:     Physical Exam Vitals signs and nursing note reviewed.  Constitutional:      General: She is not in acute distress.    Appearance: Normal appearance. She is well-developed. She is not ill-appearing, toxic-appearing or diaphoretic.  HENT:     Head: Normocephalic and atraumatic.  Neck:     Musculoskeletal: Normal range of motion.   Cardiovascular:     Rate and Rhythm: Normal rate and regular rhythm.  Pulmonary:     Effort: Pulmonary effort is normal. No respiratory distress.     Breath sounds: Normal breath sounds.  Abdominal:     General: There is no distension.     Palpations: Abdomen is soft.     Tenderness: There is no abdominal tenderness. There is no right CVA tenderness or left CVA tenderness.  Musculoskeletal: Normal range of motion.  Skin:    General: Skin is warm and dry.  Neurological:     Mental Status: She is alert and oriented to person, place, and time.  Psychiatric:        Behavior: Behavior normal.      UC Treatments / Results  Labs (all labs ordered are listed, but only abnormal results are displayed) Labs Reviewed  POCT URINE PREGNANCY    EKG   Radiology No results found.  Procedures Procedures (including critical care time)  Medications Ordered in UC Medications - No data to display  Initial Impression / Assessment and Plan / UC Course  I have reviewed the triage vital signs and the nursing notes.  Pertinent labs & imaging results that were available during my care of the patient were reviewed by me and considered in my medical decision making (see chart for details).    Urine pregnancy in UC: NEGATIVE Discussed with pt f/u with PCP or GYN Encouraged to retake home test in 7 days. AVS provided  Final Clinical Impressions(s) / UC Diagnoses   Final diagnoses:  Late menses     Discharge Instructions      It is recommended you repeat an at home pregnancy test in 7 days.  If the results are still inconclusive, please follow up with your family doctor for further evaluation of your late menses.  If you develop severe lower abdominal pain, fever, vomiting or any other new symptoms, please be re-evaluated sooner.     ED Prescriptions    None     PDMP not reviewed this encounter.   Noe Gens, [place] 07/27/19 1242

## 2019-07-26 NOTE — Discharge Instructions (Signed)
°  It is recommended you repeat an at home pregnancy test in 7 days.  If the results are still inconclusive, please follow up with your family doctor for further evaluation of your late menses.  If you develop severe lower abdominal pain, fever, vomiting or any other new symptoms, please be re-evaluated sooner.

## 2019-09-05 ENCOUNTER — Other Ambulatory Visit: Payer: Self-pay

## 2019-09-05 ENCOUNTER — Ambulatory Visit (INDEPENDENT_AMBULATORY_CARE_PROVIDER_SITE_OTHER): Payer: BC Managed Care – PPO | Admitting: Family Medicine

## 2019-09-05 ENCOUNTER — Encounter: Payer: Self-pay | Admitting: Family Medicine

## 2019-09-05 ENCOUNTER — Ambulatory Visit: Payer: BC Managed Care – PPO | Admitting: Physician Assistant

## 2019-09-05 VITALS — BP 118/92 | HR 83 | Temp 98.2°F | Ht 69.0 in | Wt 257.0 lb

## 2019-09-05 DIAGNOSIS — Z20828 Contact with and (suspected) exposure to other viral communicable diseases: Secondary | ICD-10-CM | POA: Diagnosis not present

## 2019-09-05 DIAGNOSIS — Z30011 Encounter for initial prescription of contraceptive pills: Secondary | ICD-10-CM | POA: Diagnosis not present

## 2019-09-05 DIAGNOSIS — Z20822 Contact with and (suspected) exposure to covid-19: Secondary | ICD-10-CM

## 2019-09-05 DIAGNOSIS — N898 Other specified noninflammatory disorders of vagina: Secondary | ICD-10-CM

## 2019-09-05 MED ORDER — NORGESTIMATE-ETH ESTRADIOL 0.25-35 MG-MCG PO TABS: 1.0000 | ORAL_TABLET | Freq: Every day | ORAL | 1 refills | Status: DC

## 2019-09-05 NOTE — Progress Notes (Signed)
Acute Office Visit  Subjective:    Patient ID: Megan Grant, female    DOB: Jan 22, 1996, 23 y.o.   MRN: [id number]  Chief Complaint  Patient presents with  . Vaginal odor    HPI Patient is in today for vaginal odor for about 1 week.  She is also been out of her birth control for about a month and would like a refill on that.  She has not had any abnormal vaginal discharge.  No bleeding or spotting.  She did have a yeast infection back in September when we did her Pap smear.  She would also like to be tested for STDs with GC and chlamydia.  She says there is no possible way she could be pregnant right now.  No fevers chills or sweats.  She does report that she occasionally has some right upper abdominal omental pain.  It usually is with a bowel movement feels very sore to touch around that time but then it goes away.  She says that has been going on for really long time and its not new.  She does feel like she moves her bowels pretty regularly she does not feel constipated.  She also is requesting to be tested for Covid.  She called in Monday to work with a headache.  She has not had any other symptoms but because she had called in and they are requesting that she get tested before she returned to work.  No fevers chills sweats cough etc. and the headache has resolved.  Past Medical History:  Diagnosis Date  . Anemia 07/09/2010   Qualifier: Diagnosis of  By: Linford Arnold MD, Santina Evans    . Episodic mood disorder ([facility]) 03/05/2009   Qualifier: Diagnosis of  By: Linford Arnold MD, Santina Evans      Past Surgical History:  Procedure Laterality Date  . WISDOM TOOTH EXTRACTION      Family History  Problem Relation Age of Onset  . Hypertension Mother   . Diabetes Father   . Stroke Father     Social History   Socioeconomic History  . Marital status: Single    Spouse name: Not on file  . Number of children: Not on file  . Years of education: Not on file  . Highest education level: Not on  file  Occupational History  . Occupation: Student/Work    Comment: Radio broadcast assistant  Tobacco Use  . Smoking status: Never Smoker  . Smokeless tobacco: Never Used  Substance and Sexual Activity  . Alcohol use: Yes    Alcohol/week: 0.0 standard drinks    Comment: occassional  . Drug use: Yes    Frequency: 7.0 times per week    Types: Marijuana  . Sexual activity: Yes    Partners: Male  Other Topics Concern  . Not on file  Social History Narrative   No regular exercise.    Social Determinants of Health   Financial Resource Strain:   . Difficulty of Paying Living Expenses: Not on file  Food Insecurity:   . Worried About Programme researcher, broadcasting/film/video in the Last Year: Not on file  . Ran Out of Food in the Last Year: Not on file  Transportation Needs:   . Lack of Transportation (Medical): Not on file  . Lack of Transportation (Non-Medical): Not on file  Physical Activity:   . Days of Exercise per Week: Not on file  . Minutes of Exercise per Session: Not on file  Stress:   . Feeling of Stress :  Not on file  Social Connections:   . Frequency of Communication with Friends and Family: Not on file  . Frequency of Social Gatherings with Friends and Family: Not on file  . Attends Religious Services: Not on file  . Active Member of Clubs or Organizations: Not on file  . Attends Archivist Meetings: Not on file  . Marital Status: Not on file  Intimate Partner Violence:   . Fear of Current or Ex-Partner: Not on file  . Emotionally Abused: Not on file  . Physically Abused: Not on file  . Sexually Abused: Not on file    Outpatient Medications Prior to Visit  Medication Sig Dispense Refill  . norgestimate-ethinyl estradiol (ORTHO-CYCLEN, 28,) 0.25-35 MG-MCG tablet Take 1 tablet by mouth daily. (Patient not taking: Reported on 09/05/2019) 84 tablet 4   No facility-administered medications prior to visit.    No Known Allergies  Review of Systems     Objective:    Physical  Exam Vitals reviewed.  Constitutional:      Appearance: She is well-developed.  HENT:     Head: Normocephalic and atraumatic.  Eyes:     Conjunctiva/sclera: Conjunctivae normal.  Cardiovascular:     Rate and Rhythm: Normal rate.  Pulmonary:     Effort: Pulmonary effort is normal.  Genitourinary:    General: Normal vulva.     Pubic Area: No rash.      Labia:        Right: No rash or tenderness.        Left: No rash or tenderness.      Vagina: Normal.     Cervix: Normal.     Uterus: Normal.      Rectum: Normal.  Skin:    General: Skin is dry.     Coloration: Skin is not pale.  Neurological:     Mental Status: She is alert and oriented to person, place, and time.  Psychiatric:        Behavior: Behavior normal.     BP (!) 118/92   Pulse 83   Temp 98.2 F (36.8 C) (Oral)   Ht 5\' 9"  (1.753 m)   Wt 257 lb (116.6 kg)   SpO2 99%   BMI 37.95 kg/m  Wt Readings from Last 3 Encounters:  09/05/19 257 lb (116.6 kg)  07/26/19 246 lb (111.6 kg)  07/09/19 252 lb (114.3 kg)    There are no preventive care reminders to display for this patient.  There are no preventive care reminders to display for this patient.   No results found for: TSH Lab Results  Component Value Date   WBC 9.6 05/22/2019   HGB 12.3 05/22/2019   HCT 38.9 05/22/2019   MCV 81.6 05/22/2019   PLT 296 05/22/2019   Lab Results  Component Value Date   NA 139 05/22/2019   K 4.0 05/22/2019   CO2 27 05/22/2019   GLUCOSE 78 05/22/2019   BUN 9 05/22/2019   CREATININE 0.96 05/22/2019   BILITOT 0.5 05/22/2019   ALKPHOS 53 06/23/2012   AST 13 05/22/2019   ALT 11 05/22/2019   PROT 6.7 05/22/2019   ALBUMIN 4.1 06/23/2012   CALCIUM 9.3 05/22/2019   Lab Results  Component Value Date   CHOL 116 05/22/2019   Lab Results  Component Value Date   HDL 48 (L) 05/22/2019   Lab Results  Component Value Date   LDLCALC 54 05/22/2019   Lab Results  Component Value Date   TRIG 49  05/22/2019   Lab Results   Component Value Date   CHOLHDL 2.4 05/22/2019   No results found for: HGBA1C     Assessment & Plan:   Problem List Items Addressed This Visit    None    Visit Diagnoses    Vaginal odor    -  Primary   Relevant Orders   C. trachomatis/N. gonorrhoeae RNA   WET PREP FOR TRICH, YEAST, CLUE   OCP (oral contraceptive pills) initiation       After discussion of several options patient would like to get back on birth control pills   Relevant Medications   norgestimate-ethinyl estradiol (ORTHO-CYCLEN, 28,) 0.25-35 MG-MCG tablet   Encounter for screening laboratory testing for COVID-19 virus       Relevant Orders   Novel Coronavirus, NAA ([facility])     Will call with wet prep results.    We will test for Covid so that patient can return to work she is currently asymptomatic.   Meds ordered this encounter  Medications  . norgestimate-ethinyl estradiol (ORTHO-CYCLEN, 28,) 0.25-35 MG-MCG tablet    Sig: Take 1 tablet by mouth daily.    Dispense:  84 tablet    Refill:  1     Nani Gasseratherine Geisha Abernathy, MD

## 2019-09-05 NOTE — Progress Notes (Signed)
Fyi you saw this am.

## 2019-09-06 LAB — NOVEL CORONAVIRUS, NAA: SARS-CoV-2, NAA: NOT DETECTED

## 2019-09-06 LAB — C. TRACHOMATIS/N. GONORRHOEAE RNA
C. trachomatis RNA, TMA: NOT DETECTED
N. gonorrhoeae RNA, TMA: NOT DETECTED

## 2019-09-06 LAB — WET PREP FOR TRICH, YEAST, CLUE
MICRO NUMBER:: 1203655
Specimen Quality: ADEQUATE

## 2019-09-06 NOTE — Progress Notes (Signed)
Negative GC/Chlamydia.

## 2019-09-06 NOTE — Progress Notes (Signed)
Negative for covid

## 2020-01-08 ENCOUNTER — Encounter: Payer: Self-pay | Admitting: Osteopathic Medicine

## 2020-01-08 ENCOUNTER — Ambulatory Visit (INDEPENDENT_AMBULATORY_CARE_PROVIDER_SITE_OTHER): Payer: BC Managed Care – PPO | Admitting: Osteopathic Medicine

## 2020-01-08 ENCOUNTER — Other Ambulatory Visit: Payer: Self-pay

## 2020-01-08 VITALS — BP 123/89 | HR 88 | Temp 98.4°F | Wt 232.0 lb

## 2020-01-08 DIAGNOSIS — Z30017 Encounter for initial prescription of implantable subdermal contraceptive: Secondary | ICD-10-CM | POA: Diagnosis not present

## 2020-01-08 LAB — POCT URINE PREGNANCY: Preg Test, Ur: NEGATIVE

## 2020-01-08 NOTE — Progress Notes (Signed)
Megan Grant is a 24 y.o. female who presents to  [facility] at [facility]  today, 01/08/20, seeking care for the following: . Nexplanon insertion      ASSESSMENT & PLAN with other pertinent history/findings:  The encounter diagnosis was Nexplanon insertion.  Pt of Dr Linford Arnold  Nexplanon out 05/2017 d/t irregular bleeding, has been on OCP  Today desires nexplanon insertion again   NEXPLANON INSERTION PRE-OP DIAGNOSIS: desired long-term, reversible contraception  POST-OP DIAGNOSIS: Same  PROCEDURE: Nexplanon  placement Performing Physician: Dr. Sunnie Nielsen  Risks and benefits reviewed with the patient. Informed consent obtained, patient opts to proceed today.    PROCEDURE:  Site (check): left arm  Sterile Preparation: Chlorhexidine   Insertion site was selected 8 - 10 cm from medial epicondyle and marked along with guiding site using sterile marker, toward tricep avoiding NV bundle  Procedure area was prepped and draped in a sterile fashion.3 mL of 1% lidocaine without epinephrine used for subcutaneous anesthesia. Anesthesia confirmed.  Nexplanon  trocar was inserted subcutaneously and then Nexplanon  capsule delivered subcutaneously Trocar was removed from the insertion site. Nexplanon  capsule was palpated by provider and patient to assure satisfactory placement. Estimated blood loss <1 mL Dressings applied: Steri-Strip and small pressure bandage Followup: The patient tolerated the procedure well without complications.  Standard post-procedure care is explained and return precautions are given.    There are no Patient Instructions on file for this visit.   Orders Placed This Encounter  Procedures  . POCT urine pregnancy    No orders of the defined types were placed in this encounter.      Follow-up instructions: Return for Follow-up as directed by PCP, as needed for Nexplanon care, remove in 3  years/sooner prn.                                         BP 123/89 (BP Location: Left Arm, Patient Position: Sitting, Cuff Size: Large)   Pulse 88   Temp 98.4 F (36.9 C) (Oral)   Wt 232 lb (105.2 kg)   BMI 34.26 kg/m   No outpatient medications have been marked as taking for the 01/08/20 encounter (Office Visit) with Sunnie Nielsen, DO.    Results for orders placed or performed in visit on 01/08/20 (from the past 72 hour(s))  POCT urine pregnancy     Status: None   Collection Time: 01/08/20 11:32 AM  Result Value Ref Range   Preg Test, Ur Negative Negative    No results found.  Depression screen [facility] 2/9 05/22/2019 09/15/2017 01/18/2017  Decreased Interest 0 0 2  Down, Depressed, Hopeless 0 1 1  PHQ - 2 Score 0 1 3  Altered sleeping - - 1  Tired, decreased energy - - 2  Change in appetite - - 0  Feeling bad or failure about yourself  - - 2  Trouble concentrating - - 1  Moving slowly or fidgety/restless - - 0  Suicidal thoughts - - 0  PHQ-9 Score - - 9    No flowsheet data found.    All questions at time of visit were answered - patient instructed to contact office with any additional concerns or updates.  ER/RTC precautions were reviewed with the patient.  Please note: voice recognition software was used to produce this document, and typos may escape review. Please contact  Dr. Sheppard Coil for any needed clarificat

## 2020-01-22 ENCOUNTER — Telehealth: Payer: Self-pay

## 2020-01-22 NOTE — Telephone Encounter (Signed)
Megan Grant called and asked if it is normal not to have a period while on Nexplanon. It was placed on 01/08/20. She states her monthly normally comes around the 4th. She reports no bleeding or spotting. She is having period like cramping. She just wants to know if it is normal to not have a period.

## 2020-01-22 NOTE — Telephone Encounter (Signed)
Left message advising of recommendations.  

## 2020-01-22 NOTE — Telephone Encounter (Signed)
Yes, Can be very normal to skip for spot.  Similar to the Depo-Provera injection.

## 2020-04-07 ENCOUNTER — Encounter: Payer: BC Managed Care – PPO | Admitting: Nurse Practitioner

## 2020-04-10 ENCOUNTER — Ambulatory Visit (INDEPENDENT_AMBULATORY_CARE_PROVIDER_SITE_OTHER): Payer: BC Managed Care – PPO | Admitting: Nurse Practitioner

## 2020-04-10 ENCOUNTER — Other Ambulatory Visit: Payer: Self-pay

## 2020-04-10 ENCOUNTER — Encounter: Payer: Self-pay | Admitting: Nurse Practitioner

## 2020-04-10 VITALS — BP 113/82 | HR 77 | Temp 98.0°F | Ht 69.0 in | Wt 233.4 lb

## 2020-04-10 DIAGNOSIS — Z0000 Encounter for general adult medical examination without abnormal findings: Secondary | ICD-10-CM | POA: Diagnosis not present

## 2020-04-10 DIAGNOSIS — Z113 Encounter for screening for infections with a predominantly sexual mode of transmission: Secondary | ICD-10-CM

## 2020-04-10 NOTE — Progress Notes (Signed)
Established Patient Office Visit  Subjective:  Patient ID: Megan Grant, female    DOB: 03-31-1996  Age: 24 y.o. MRN: [id number]  CC:  Chief Complaint  Patient presents with  . Annual Exam  . Screening for STI    Pt     HPI Megan Grant presents for her annual physical exam. She also wishes to have STI testing today.   Concerns Today: None- healthy overall Would like to have STD testing  Vaccines:   Influenza q 1y: due this fall  COVID-19: declined  Td q 10y: due 08/30/2026      HPV (until 26 or until 45 if pt wishes): completed   Vision Exam q1-5y: wears glasses with regular vision exams Dental Exam q 33m: regular exams every 6 months Diet: "Probably not great" No dietary restrictions. Eats more fast food recently.  Exercise/Activity: Was walking about 5 miles a day, but has not been active since COVID Work/School: Customer service- sedentary job ETOH/Drugs/Nicotine/Vaping: Marijuana daily, no cigarettes, ETOH about twice a week, no other ilicit drugs Sexual health/STI hx/#Partners/Contraception/Protection: currently sexually active in a monogamous relationship, female partner, nexplanon implant for birth control, no STI protection used. No vaginal discharge, itching, burning, or pain.  Menstrual Cycle: on nexplanon just intermittent, light spotting Skin Changes/Moles/Sunburns: none Bowel Changes: none Bladder Changes: none  Labs  Pap w/ cytology q 3y 21-29  Pap w/ cytology + HPV 30-65: Due 05/2022  HIV- everyone at least 1 time b/w 13-64: Ordered today  Hep C- high risk: Recommended- ordered today  Syphilis (MSM, HIV+, high risk): Ordered today  Chlamydia/Gonorrhea- all sexually active adults </= 24, all persons on birth control, high risk:  ordered today   CBC, CMP, Lipids: Ordered today  TSH- high risk or symptoms: no family history/no symptoms- not ordered  HbA1c- high risk or symptoms: family history with no symptoms- not ordered Patient is not fasting, but wishes  to have labs drawn today while she is here.    Past Medical History:  Diagnosis Date  . Anemia 07/09/2010   Qualifier: Diagnosis of  By: Linford Arnold MD, Santina Evans    . Episodic mood disorder (HCC) 03/05/2009   Qualifier: Diagnosis of  By: Linford Arnold MD, Santina Evans      Past Surgical History:  Procedure Laterality Date  . WISDOM TOOTH EXTRACTION      Family History  Problem Relation Age of Onset  . Hypertension Mother   . Diabetes Father   . Stroke Father     Social History   Socioeconomic History  . Marital status: Single    Spouse name: Not on file  . Number of children: Not on file  . Years of education: Not on file  . Highest education level: Not on file  Occupational History  . Occupation: Student/Work    Comment: Radio broadcast assistant  Tobacco Use  . Smoking status: Never Smoker  . Smokeless tobacco: Never Used  Vaping Use  . Vaping Use: Never used  Substance and Sexual Activity  . Alcohol use: Yes    Alcohol/week: 7.0 standard drinks    Types: 7 Standard drinks or equivalent per week    Comment: 1 drink/day, wine or liquor  . Drug use: Yes    Frequency: 7.0 times per week    Types: Marijuana  . Sexual activity: Yes    Partners: Male    Birth control/protection: Implant  Other Topics Concern  . Not on file  Social History Narrative   No regular exercise.  Social Determinants of Health   Financial Resource Strain:   . Difficulty of Paying Living Expenses:   Food Insecurity:   . Worried About Programme researcher, broadcasting/film/video in the Last Year:   . Barista in the Last Year:   Transportation Needs:   . Freight forwarder (Medical):   Marland Kitchen Lack of Transportation (Non-Medical):   Physical Activity:   . Days of Exercise per Week:   . Minutes of Exercise per Session:   Stress:   . Feeling of Stress :   Social Connections:   . Frequency of Communication with Friends and Family:   . Frequency of Social Gatherings with Friends and Family:   . Attends Religious  Services:   . Active Member of Clubs or Organizations:   . Attends Banker Meetings:   Marland Kitchen Marital Status:   Intimate Partner Violence:   . Fear of Current or Ex-Partner:   . Emotionally Abused:   Marland Kitchen Physically Abused:   . Sexually Abused:     Outpatient Medications Prior to Visit  Medication Sig Dispense Refill  . norgestimate-ethinyl estradiol (ORTHO-CYCLEN, 28,) 0.25-35 MG-MCG tablet Take 1 tablet by mouth daily. (Patient not taking: Reported on 01/08/2020) 84 tablet 1   No facility-administered medications prior to visit.    No Known Allergies    Objective:    Physical Exam Vitals and nursing note reviewed.  Constitutional:      Appearance: Normal appearance.  HENT:     Head: Normocephalic.     Right Ear: Ear canal and external ear normal. There is impacted cerumen.     Left Ear: Ear canal and external ear normal. There is impacted cerumen.     Nose: Nose normal.     Mouth/Throat:     Mouth: Mucous membranes are moist.     Pharynx: Oropharynx is clear.     Tonsils: 2+ on the right. 2+ on the left.     Comments: Enlarged tonsils without any other symptoms present- recent strep throat illness and treatment.  Eyes:     Extraocular Movements: Extraocular movements intact.     Conjunctiva/sclera: Conjunctivae normal.     Pupils: Pupils are equal, round, and reactive to light.  Neck:     Vascular: No carotid bruit.  Cardiovascular:     Rate and Rhythm: Normal rate and regular rhythm.     Pulses: Normal pulses.     Heart sounds: Normal heart sounds.  Pulmonary:     Effort: Pulmonary effort is normal.     Breath sounds: Normal breath sounds.  Abdominal:     General: Abdomen is flat. Bowel sounds are normal. There is no distension.     Palpations: Abdomen is soft.     Tenderness: There is no abdominal tenderness. There is no right CVA tenderness or left CVA tenderness.  Musculoskeletal:        General: Normal range of motion.     Cervical back: Normal range  of motion. No rigidity or tenderness.     Right lower leg: No edema.     Left lower leg: No edema.  Lymphadenopathy:     Cervical: No cervical adenopathy.  Skin:    General: Skin is warm and dry.     Capillary Refill: Capillary refill takes less than 2 seconds.  Neurological:     General: No focal deficit present.     Mental Status: She is alert and oriented to person, place, and time.  Psychiatric:  Mood and Affect: Mood normal.        Behavior: Behavior normal.        Thought Content: Thought content normal.        Judgment: Judgment normal.     BP 113/82   Pulse 77   Temp 98 F (36.7 C) (Oral)   Ht 5\' 9"  (1.753 m)   Wt 233 lb 6.4 oz (105.9 kg)   SpO2 100%   BMI 34.47 kg/m  Wt Readings from Last 3 Encounters:  04/10/20 233 lb 6.4 oz (105.9 kg)  01/08/20 232 lb (105.2 kg)  09/05/19 257 lb (116.6 kg)     Health Maintenance Due  Topic Date Due  . Hepatitis C Screening  Never done    There are no preventive care reminders to display for this patient.  No results found for: TSH Lab Results  Component Value Date   WBC 9.6 05/22/2019   HGB 12.3 05/22/2019   HCT 38.9 05/22/2019   MCV 81.6 05/22/2019   PLT 296 05/22/2019   Lab Results  Component Value Date   NA 139 05/22/2019   K 4.0 05/22/2019   CO2 27 05/22/2019   GLUCOSE 78 05/22/2019   BUN 9 05/22/2019   CREATININE 0.96 05/22/2019   BILITOT 0.5 05/22/2019   ALKPHOS 53 06/23/2012   AST 13 05/22/2019   ALT 11 05/22/2019   PROT 6.7 05/22/2019   ALBUMIN 4.1 06/23/2012   CALCIUM 9.3 05/22/2019   Lab Results  Component Value Date   CHOL 116 05/22/2019   Lab Results  Component Value Date   HDL 48 (L) 05/22/2019   Lab Results  Component Value Date   LDLCALC 54 05/22/2019   Lab Results  Component Value Date   TRIG 49 05/22/2019   Lab Results  Component Value Date   CHOLHDL 2.4 05/22/2019   No results found for: HGBA1C    Assessment & Plan:   1. Encounter for annual physical  exam Annual physical exam for healthy 24 year old female. She is working on weight loss by controlling her portion sizes and has lost 25 pounds since December, 2020. Continue working on weight loss goals. We will perform STI testing today for gonorrhea, chlamydia, syphilis, and HIV. Will also perform Hep C testing today. Will perform CBC, CMP, and lipids today. No concerns otherwise.  - CBC with Differential/Platelet - COMPLETE METABOLIC PANEL WITH GFR - Lipid panel - Hepatitis C Antibody - C. trachomatis/N. gonorrhoeae RNA - Follow-up in one year for annual physical exam or sooner if needed.   2. Screen for STD (sexually transmitted disease) Currently sexually active female in monogamous heterosexual relationship requesting STI testing. No symptoms today of infection. Currently using nexplanon implant for contraception. Discussed testing options and suggestions. Will notify patient of results and make changes to plan of care as necessary.  - C. trachomatis/N. gonorrhoeae RNA - RPR - HIV antibody (with reflex)   04-20-1995, NP

## 2020-04-10 NOTE — Patient Instructions (Addendum)
Your last Pap was last year and it was normal. Recommendations are for a pap test every 3 years until age 26. You will be due for that again in 2 years (2023).   I have placed orders for the labs of CBC, CMP, lipids, Hepatitis C, Syphilis, and HIV.    There is an over the counter medication drop called Debrox that can help with ear wax build-up. Follow the directions on the packaging to help clear the wax from your ears.    Health Maintenance, Female Adopting a healthy lifestyle and getting preventive care are important in promoting health and wellness. Ask your health care provider about:  The right schedule for you to have regular tests and exams.  Things you can do on your own to prevent diseases and keep yourself healthy. What should I know about diet, weight, and exercise? Eat a healthy diet   Eat a diet that includes plenty of vegetables, fruits, low-fat dairy products, and lean protein.  Do not eat a lot of foods that are high in solid fats, added sugars, or sodium. Maintain a healthy weight Body mass index (BMI) is used to identify weight problems. It estimates body fat based on height and weight. Your health care provider can help determine your BMI and help you achieve or maintain a healthy weight. Get regular exercise Get regular exercise. This is one of the most important things you can do for your health. Most adults should:  Exercise for at least 150 minutes each week. The exercise should increase your heart rate and make you sweat (moderate-intensity exercise).  Do strengthening exercises at least twice a week. This is in addition to the moderate-intensity exercise.  Spend less time sitting. Even light physical activity can be beneficial. Watch cholesterol and blood lipids Have your blood tested for lipids and cholesterol at 26 years of age, then have this test every 5 years. Have your cholesterol levels checked more often if:  Your lipid or cholesterol levels are  high.  You are older than 26 years of age.  You are at high risk for heart disease. What should I know about cancer screening? Depending on your health history and family history, you may need to have cancer screening at various ages. This may include screening for:  Breast cancer.  Cervical cancer.  Colorectal cancer.  Skin cancer.  Lung cancer. What should I know about heart disease, diabetes, and high blood pressure? Blood pressure and heart disease  High blood pressure causes heart disease and increases the risk of stroke. This is more likely to develop in people who have high blood pressure readings, are of African descent, or are overweight.  Have your blood pressure checked: ? Every 3-5 years if you are 66-8 years of age. ? Every year if you are 27 years old or older. Diabetes Have regular diabetes screenings. This checks your fasting blood sugar level. Have the screening done:  Once every three years after age 51 if you are at a normal weight and have a low risk for diabetes.  More often and at a younger age if you are overweight or have a high risk for diabetes. What should I know about preventing infection? Hepatitis B If you have a higher risk for hepatitis B, you should be screened for this virus. Talk with your health care provider to find out if you are at risk for hepatitis B infection. Hepatitis C Testing is recommended for:  Everyone born from [birth year] through 1967.  Anyone with known risk factors for hepatitis C. Sexually transmitted infections (STIs)  Get screened for STIs, including gonorrhea and chlamydia, if: ? You are sexually active and are younger than 26 years of age. ? You are older than 27 years of age and your health care provider tells you that you are at risk for this type of infection. ? Your sexual activity has changed since you were last screened, and you are at increased risk for chlamydia or gonorrhea. Ask your health care provider if you  are at risk.  Ask your health care provider about whether you are at high risk for HIV. Your health care provider may recommend a prescription medicine to help prevent HIV infection. If you choose to take medicine to prevent HIV, you should first get tested for HIV. You should then be tested every 3 months for as long as you are taking the medicine. Pregnancy  If you are about to stop having your period (premenopausal) and you may become pregnant, seek counseling before you get pregnant.  Take 400 to 800 micrograms (mcg) of folic acid every day if you become pregnant.  Ask for birth control (contraception) if you want to prevent pregnancy. Osteoporosis and menopause Osteoporosis is a disease in which the bones lose minerals and strength with aging. This can result in bone fractures. If you are 62 years old or older, or if you are at risk for osteoporosis and fractures, ask your health care provider if you should:  Be screened for bone loss.  Take a calcium or vitamin D supplement to lower your risk of fractures.  Be given hormone replacement therapy (HRT) to treat symptoms of menopause. Follow these instructions at home: Lifestyle  Do not use any products that contain nicotine or tobacco, such as cigarettes, e-cigarettes, and chewing tobacco. If you need help quitting, ask your health care provider.  Do not use street drugs.  Do not share needles.  Ask your health care provider for help if you need support or information about quitting drugs. Alcohol use  Do not drink alcohol if: ? Your health care provider tells you not to drink. ? You are pregnant, may be pregnant, or are planning to become pregnant.  If you drink alcohol: ? Limit how much you use to 0-1 drink a day. ? Limit intake if you are breastfeeding.  Be aware of how much alcohol is in your drink. In the [place] one drink equals one 12 oz bottle of beer (355 mL), one 5 oz glass of wine (148 mL), or one 1 oz glass of hard  liquor (44 mL). General instructions  Schedule regular health, dental, and eye exams.  Stay current with your vaccines.  Tell your health care provider if: ? You often feel depressed. ? You have ever been abused or do not feel safe at home. Summary  Adopting a healthy lifestyle and getting preventive care are important in promoting health and wellness.  Follow your health care provider's instructions about healthy diet, exercising, and getting tested or screened for diseases.  Follow your health care provider's instructions on monitoring your cholesterol and blood pressure. This information is not intended to replace advice given to you by your health care provider. Make sure you discuss any questions you have with your health care provider. Document Revised: 08/30/2018 Document Reviewed: 08/30/2018 Elsevier Patient Education  2020 [employer]   Calorie Counting for [employer] Calories are units of energy. Your body needs a certain amount of calories from  food to keep you going throughout the day. When you eat more calories than your body needs, your body stores the extra calories as fat. When you eat fewer calories than your body needs, your body burns fat to get the energy it needs. Calorie counting means keeping track of how many calories you eat and drink each day. Calorie counting can be helpful if you need to lose weight. If you make sure to eat fewer calories than your body needs, you should lose weight. Ask your health care provider what a healthy weight is for you. For calorie counting to work, you will need to eat the right number of calories in a day in order to lose a healthy amount of weight per week. A dietitian can help you determine how many calories you need in a day and will give you suggestions on how to reach your calorie goal.  A healthy amount of weight to lose per week is usually 1-2 lb (0.5-0.9 kg). This usually means that your daily calorie intake should be  reduced by 500-750 calories.  Eating 1,200 - 1,500 calories per day can help most women lose weight.  Eating 1,500 - 1,800 calories per day can help most men lose weight. What is my plan? My goal is to have __________ calories per day. If I have this many calories per day, I should lose around __________ pounds per week. What do I need to know about calorie counting? In order to meet your daily calorie goal, you will need to:  Find out how many calories are in each food you would like to eat. Try to do this before you eat.  Decide how much of the food you plan to eat.  Write down what you ate and how many calories it had. Doing this is called keeping a food log. To successfully lose weight, it is important to balance calorie counting with a healthy lifestyle that includes regular activity. Aim for 150 minutes of moderate exercise (such as walking) or 75 minutes of vigorous exercise (such as running) each week. Where do I find calorie information?  The number of calories in a food can be found on a Nutrition Facts label. If a food does not have a Nutrition Facts label, try to look up the calories online or ask your dietitian for help. Remember that calories are listed per serving. If you choose to have more than one serving of a food, you will have to multiply the calories per serving by the amount of servings you plan to eat. For example, the label on a package of bread might say that a serving size is 1 slice and that there are 90 calories in a serving. If you eat 1 slice, you will have eaten 90 calories. If you eat 2 slices, you will have eaten 180 calories. How do I keep a food log? Immediately after each meal, record the following information in your food log:  What you ate. Don't forget to include toppings, sauces, and other extras on the food.  How much you ate. This can be measured in cups, ounces, or number of items.  How many calories each food and drink had.  The total number  of calories in the meal. Keep your food log near you, such as in a small notebook in your pocket, or use a mobile app or website. Some programs will calculate calories for you and show you how many calories you have left for the day to meet your  goal. What are some calorie counting tips?   Use your calories on foods and drinks that will fill you up and not leave you hungry: ? Some examples of foods that fill you up are nuts and nut butters, vegetables, lean proteins, and high-fiber foods like whole grains. High-fiber foods are foods with more than 5 g fiber per serving. ? Drinks such as sodas, specialty coffee drinks, alcohol, and juices have a lot of calories, yet do not fill you up.  Eat nutritious foods and avoid empty calories. Empty calories are calories you get from foods or beverages that do not have many vitamins or protein, such as candy, sweets, and soda. It is better to have a nutritious high-calorie food (such as an avocado) than a food with few nutrients (such as a bag of chips).  Know how many calories are in the foods you eat most often. This will help you calculate calorie counts faster.  Pay attention to calories in drinks. Low-calorie drinks include water and unsweetened drinks.  Pay attention to nutrition labels for "low fat" or "fat free" foods. These foods sometimes have the same amount of calories or more calories than the full fat versions. They also often have added sugar, starch, or salt, to make up for flavor that was removed with the fat.  Find a way of tracking calories that works for you. Get creative. Try different apps or programs if writing down calories does not work for you. What are some portion control tips?  Know how many calories are in a serving. This will help you know how many servings of a certain food you can have.  Use a measuring cup to measure serving sizes. You could also try weighing out portions on a kitchen scale. With time, you will be able to  estimate serving sizes for some foods.  Take some time to put servings of different foods on your favorite plates, bowls, and cups so you know what a serving looks like.  Try not to eat straight from a bag or box. Doing this can lead to overeating. Put the amount you would like to eat in a cup or on a plate to make sure you are eating the right portion.  Use smaller plates, glasses, and bowls to prevent overeating.  Try not to multitask (for example, watch TV or use your computer) while eating. If it is time to eat, sit down at a table and enjoy your food. This will help you to know when you are full. It will also help you to be aware of what you are eating and how much you are eating. What are tips for following this plan? Reading food labels  Check the calorie count compared to the serving size. The serving size may be smaller than what you are used to eating.  Check the source of the calories. Make sure the food you are eating is high in vitamins and protein and low in saturated and trans fats. Shopping  Read nutrition labels while you shop. This will help you make healthy decisions before you decide to purchase your food.  Make a grocery list and stick to it. Cooking  Try to cook your favorite foods in a healthier way. For example, try baking instead of frying.  Use low-fat dairy products. Meal planning  Use more fruits and vegetables. Half of your plate should be fruits and vegetables.  Include lean proteins like poultry and fish. How do I count calories when eating out?  Ask for smaller portion sizes.  Consider sharing an entree and sides instead of getting your own entree.  If you get your own entree, eat only half. Ask for a box at the beginning of your meal and put the rest of your entree in it so you are not tempted to eat it.  If calories are listed on the menu, choose the lower calorie options.  Choose dishes that include vegetables, fruits, whole grains, low-fat  dairy products, and lean protein.  Choose items that are boiled, broiled, grilled, or steamed. Stay away from items that are buttered, battered, fried, or served with cream sauce. Items labeled "crispy" are usually fried, unless stated otherwise.  Choose water, low-fat milk, unsweetened iced tea, or other drinks without added sugar. If you want an alcoholic beverage, choose a lower calorie option such as a glass of wine or light beer.  Ask for dressings, sauces, and syrups on the side. These are usually high in calories, so you should limit the amount you eat.  If you want a salad, choose a garden salad and ask for grilled meats. Avoid extra toppings like bacon, cheese, or fried items. Ask for the dressing on the side, or ask for olive oil and vinegar or lemon to use as dressing.  Estimate how many servings of a food you are given. For example, a serving of cooked rice is  cup or about the size of half a baseball. Knowing serving sizes will help you be aware of how much food you are eating at restaurants. The list below tells you how big or small some common portion sizes are based on everyday objects: ? 1 oz--4 stacked dice. ? 3 oz--1 deck of cards. ? 1 tsp--1 die. ? 1 Tbsp-- a ping-pong ball. ? 2 Tbsp--1 ping-pong ball. ?  cup-- baseball. ? 1 cup--1 baseball. Summary  Calorie counting means keeping track of how many calories you eat and drink each day. If you eat fewer calories than your body needs, you should lose weight.  A healthy amount of weight to lose per week is usually 1-2 lb (0.5-0.9 kg). This usually means reducing your daily calorie intake by 500-750 calories.  The number of calories in a food can be found on a Nutrition Facts label. If a food does not have a Nutrition Facts label, try to look up the calories online or ask your dietitian for help.  Use your calories on foods and drinks that will fill you up, and not on foods and drinks that will leave you hungry.  Use  smaller plates, glasses, and bowls to prevent overeating. This information is not intended to replace advice given to you by your health care provider. Make sure you discuss any questions you have with your health care provider. Document Revised: 05/26/2018 Document Reviewed: 08/06/2016 Elsevier Patient Education  2020 [employer].

## 2020-04-14 LAB — CBC WITH DIFFERENTIAL/PLATELET

## 2020-04-14 LAB — RPR

## 2020-04-14 LAB — COMPLETE METABOLIC PANEL WITH GFR

## 2020-04-14 LAB — LIPID PANEL

## 2020-04-14 LAB — C. TRACHOMATIS/N. GONORRHOEAE RNA
C. trachomatis RNA, TMA: NOT DETECTED
N. gonorrhoeae RNA, TMA: NOT DETECTED

## 2020-04-14 LAB — HEPATITIS C ANTIBODY

## 2020-04-14 LAB — HIV ANTIBODY (ROUTINE TESTING W REFLEX)

## 2020-05-22 ENCOUNTER — Ambulatory Visit: Payer: BC Managed Care – PPO | Admitting: Osteopathic Medicine

## 2020-05-29 ENCOUNTER — Encounter: Payer: Self-pay | Admitting: Osteopathic Medicine

## 2020-05-29 ENCOUNTER — Ambulatory Visit (INDEPENDENT_AMBULATORY_CARE_PROVIDER_SITE_OTHER): Payer: Self-pay | Admitting: Osteopathic Medicine

## 2020-05-29 VITALS — BP 124/88 | HR 80 | Wt 245.0 lb

## 2020-05-29 DIAGNOSIS — Z3046 Encounter for surveillance of implantable subdermal contraceptive: Secondary | ICD-10-CM

## 2020-05-29 DIAGNOSIS — Z30011 Encounter for initial prescription of contraceptive pills: Secondary | ICD-10-CM

## 2020-05-29 MED ORDER — NORGESTIMATE-ETH ESTRADIOL 0.25-35 MG-MCG PO TABS: 1.0000 | ORAL_TABLET | Freq: Every day | ORAL | 3 refills | Status: DC

## 2020-05-29 NOTE — Progress Notes (Signed)
Nexplanon Removal Procedure Note PRE-OP DIAGNOSIS: Nexplanon, desire for change of contraception  POST-OP DIAGNOSIS: Same  PROCEDURE: Nexplanon Removal  Performing Physician:Aneshia Jacquet PROCEDURE:  Anesthesia: 2% Lidocaine w/ epinephrine 2 ml  Procedure: Consent obtained. A time-out was performed prior to initiating procedure to be sure of right patient and right location. The area surrounding the Nexplanon was prepared in the usual sterile manner. The site was anesthetized with lidocaine. A skin incision was made over the distal aspect of the device. The capsule lysed sharply and the device removed using a hemostat. Hemostasis was assured. The site was dressed with SteriStrips. The patient tolerated the procedure well.  Followup: The patient tolerated the procedure well without complications. Standard post-procedure care is explained and return precautions are given. Contraception is advised until/unless conception is desired.

## 2020-06-20 ENCOUNTER — Ambulatory Visit (INDEPENDENT_AMBULATORY_CARE_PROVIDER_SITE_OTHER): Payer: Self-pay | Admitting: Nurse Practitioner

## 2020-06-20 ENCOUNTER — Other Ambulatory Visit: Payer: Self-pay

## 2020-06-20 ENCOUNTER — Encounter: Payer: Self-pay | Admitting: Nurse Practitioner

## 2020-06-20 VITALS — BP 128/88 | HR 79 | Ht 69.0 in | Wt 251.0 lb

## 2020-06-20 DIAGNOSIS — Z111 Encounter for screening for respiratory tuberculosis: Secondary | ICD-10-CM

## 2020-06-20 DIAGNOSIS — M5441 Lumbago with sciatica, right side: Secondary | ICD-10-CM

## 2020-06-20 MED ORDER — MELOXICAM 15 MG PO TABS: 15.0000 mg | ORAL_TABLET | Freq: Every day | ORAL | 3 refills | Status: DC

## 2020-06-20 MED ORDER — GABAPENTIN 100 MG PO CAPS: ORAL_CAPSULE | ORAL | 0 refills | Status: DC
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
## 2020-06-20 NOTE — Addendum Note (Signed)
Addended by: Deno Etienne on: 06/20/2020 10:17 AM   Modules accepted: Orders

## 2020-06-20 NOTE — Assessment & Plan Note (Signed)
Symptoms and presentation consistent with sciatic nerve pain in the right lower extremity.  Imaging has been obtained, so I will avoid repeat imaging today- will consider further imaging if symptoms do not improve with conservative treatment. She does not have any new or alert signs that would indicate worsening symptoms.  Plan to use ice and heat to the lower back for 20 minutes each at least once per day (recommend 3-4) for the next 5 days.  Gentle stretching exercises provided for low back- recommend at least twice a day and gentle stretching every 30 minutes while seated for long periods.  Meloxicam daily for inflammation.  Gabapentin 100mg  up to every 8 hours during the day as needed only and 300mg  at night for nerve pain.  Referral to physical therapy placed.  If symptoms do not improve with conservative measures, consider MRI of lower spine and referral to Dr. for further evaluation.
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
## 2020-06-20 NOTE — Progress Notes (Signed)
Ppd place in R arm

## 2020-06-23 ENCOUNTER — Ambulatory Visit (INDEPENDENT_AMBULATORY_CARE_PROVIDER_SITE_OTHER): Payer: Self-pay | Admitting: Family Medicine

## 2020-06-23 ENCOUNTER — Other Ambulatory Visit: Payer: Self-pay

## 2020-06-23 ENCOUNTER — Encounter: Payer: Self-pay | Admitting: Family Medicine

## 2020-06-23 VITALS — BP 127/90 | HR 86

## 2020-06-23 DIAGNOSIS — Z111 Encounter for screening for respiratory tuberculosis: Secondary | ICD-10-CM

## 2020-06-23 LAB — TB SKIN TEST
Induration: 0 mm
TB Skin Test: NEGATIVE

## 2020-06-23 NOTE — Progress Notes (Signed)
TB negative.

## 2020-06-23 NOTE — Progress Notes (Signed)
Agree with documentation as above.   Dawn Kiper, MD  

## 2020-06-23 NOTE — Progress Notes (Signed)
Established Patient Office Visit  Subjective:  Patient ID: Megan Grant, female    DOB: 1995-09-29  Age: 24 y.o. MRN: [id number]  CC:  Chief Complaint  Patient presents with  . PPD Reading    HPI [name] presents for PPD reading.   Past Medical History:  Diagnosis Date  . Anemia 07/09/2010   Qualifier: Diagnosis of  By: Linford Arnold MD, Santina Evans    . Episodic mood disorder (HCC) 03/05/2009   Qualifier: Diagnosis of  By: Linford Arnold MD, Santina Evans      Past Surgical History:  Procedure Laterality Date  . WISDOM TOOTH EXTRACTION      Family History  Problem Relation Age of Onset  . Hypertension Mother   . Diabetes Father   . Stroke Father     Social History   Socioeconomic History  . Marital status: Single    Spouse name: Not on file  . Number of children: Not on file  . Years of education: Not on file  . Highest education level: Not on file  Occupational History  . Occupation: Student/Work    Comment: Radio broadcast assistant  Tobacco Use  . Smoking status: Never Smoker  . Smokeless tobacco: Never Used  Vaping Use  . Vaping Use: Never used  Substance and Sexual Activity  . Alcohol use: Yes    Alcohol/week: 7.0 standard drinks    Types: 7 Standard drinks or equivalent per week    Comment: 1 drink/day, wine or liquor  . Drug use: Yes    Frequency: 7.0 times per week    Types: Marijuana  . Sexual activity: Yes    Partners: Male    Birth control/protection: Implant  Other Topics Concern  . Not on file  Social History Narrative   No regular exercise.    Social Determinants of Health   Financial Resource Strain:   . Difficulty of Paying Living Expenses: Not on file  Food Insecurity:   . Worried About Programme researcher, broadcasting/film/video in the Last Year: Not on file  . Ran Out of Food in the Last Year: Not on file  Transportation Needs:   . Lack of Transportation (Medical): Not on file  . Lack of Transportation (Non-Medical): Not on file  Physical Activity:   . Days of  Exercise per Week: Not on file  . Minutes of Exercise per Session: Not on file  Stress:   . Feeling of Stress : Not on file  Social Connections:   . Frequency of Communication with Friends and Family: Not on file  . Frequency of Social Gatherings with Friends and Family: Not on file  . Attends Religious Services: Not on file  . Active Member of Clubs or Organizations: Not on file  . Attends Banker Meetings: Not on file  . Marital Status: Not on file  Intimate Partner Violence:   . Fear of Current or Ex-Partner: Not on file  . Emotionally Abused: Not on file  . Physically Abused: Not on file  . Sexually Abused: Not on file    Outpatient Medications Prior to Visit  Medication Sig Dispense Refill  . gabapentin (NEURONTIN) 100 MG capsule You may take 1 capsule (100mg ) up to every 8 hours during the day and up to 3 capsules (300mg ) at bedtime for leg pain. 120 capsule 0  . meloxicam (MOBIC) 15 MG tablet Take 1 tablet (15 mg total) by mouth daily. 30 tablet 3  . norgestimate-ethinyl estradiol (ORTHO-CYCLEN, 28,) 0.25-35 MG-MCG tablet Take 1  tablet by mouth daily. 84 tablet 3   No facility-administered medications prior to visit.    No Known Allergies  ROS Review of Systems    Objective:    Physical Exam  BP 127/90   Pulse 86   SpO2 100%  Wt Readings from Last 3 Encounters:  06/20/20 251 lb (113.9 kg)  05/29/20 245 lb (111.1 kg)  04/10/20 233 lb 6.4 oz (105.9 kg)     Health Maintenance Due  Topic Date Due  . COVID-19 Vaccine (2 - Moderna 2-dose series) 06/06/2020    There are no preventive care reminders to display for this patient.  No results found for: TSH Lab Results  Component Value Date   WBC CANCELED 04/10/2020   HGB 12.3 05/22/2019   HCT 38.9 05/22/2019   MCV 81.6 05/22/2019   PLT 296 05/22/2019   Lab Results  Component Value Date   NA 139 05/22/2019   K 4.0 05/22/2019   CO2 27 05/22/2019   GLUCOSE CANCELED 04/10/2020   BUN 9  05/22/2019   CREATININE 0.96 05/22/2019   BILITOT 0.5 05/22/2019   ALKPHOS 53 06/23/2012   AST 13 05/22/2019   ALT 11 05/22/2019   PROT 6.7 05/22/2019   ALBUMIN 4.1 06/23/2012   CALCIUM 9.3 05/22/2019   Lab Results  Component Value Date   CHOL 116 05/22/2019   Lab Results  Component Value Date   HDL 48 (L) 05/22/2019   Lab Results  Component Value Date   LDLCALC CANCELED 04/10/2020   Lab Results  Component Value Date   TRIG 49 05/22/2019   Lab Results  Component Value Date   CHOLHDL 2.4 05/22/2019   No results found for: HGBA1C    Assessment & Plan:  Screening TB - PPD negative with 0 mm.    Problem List Items Addressed This Visit    None    Visit Diagnoses    Screening-pulmonary TB    -  Primary      No orders of the defined types were placed in this encounter.   Follow-up: No follow-ups on file.    Esmond Harps, CMA

## 2020-06-27 ENCOUNTER — Encounter: Payer: Self-pay | Admitting: Family Medicine

## 2020-07-03 ENCOUNTER — Ambulatory Visit: Payer: Self-pay | Admitting: Physical Therapy

## 2020-07-29 ENCOUNTER — Ambulatory Visit: Payer: Medicaid Other

## 2020-07-31 ENCOUNTER — Ambulatory Visit: Payer: Medicaid Other

## 2020-08-05 ENCOUNTER — Ambulatory Visit (INDEPENDENT_AMBULATORY_CARE_PROVIDER_SITE_OTHER): Payer: Self-pay | Admitting: Family Medicine

## 2020-08-05 VITALS — BP 124/88 | HR 76

## 2020-08-05 DIAGNOSIS — Z111 Encounter for screening for respiratory tuberculosis: Secondary | ICD-10-CM

## 2020-08-05 NOTE — Progress Notes (Signed)
Agree with documentation as above.   Briseida Gittings, MD  

## 2020-08-05 NOTE — Progress Notes (Signed)
Established Patient Office Visit  Subjective:  Patient ID: Megan Grant, female    DOB: 1996/05/01  Age: 24 y.o. MRN: [id number]  CC:  Chief Complaint  Patient presents with  . PPD Placement    HPI [name] presents for PPD placement.   Past Medical History:  Diagnosis Date  . Anemia 07/09/2010   Qualifier: Diagnosis of  By: Linford Arnold MD, Santina Evans    . Episodic mood disorder (HCC) 03/05/2009   Qualifier: Diagnosis of  By: Linford Arnold MD, Santina Evans      Past Surgical History:  Procedure Laterality Date  . WISDOM TOOTH EXTRACTION      Family History  Problem Relation Age of Onset  . Hypertension Mother   . Diabetes Father   . Stroke Father     Social History   Socioeconomic History  . Marital status: Single    Spouse name: Not on file  . Number of children: Not on file  . Years of education: Not on file  . Highest education level: Not on file  Occupational History  . Occupation: Student/Work    Comment: Radio broadcast assistant  Tobacco Use  . Smoking status: Never Smoker  . Smokeless tobacco: Never Used  Vaping Use  . Vaping Use: Never used  Substance and Sexual Activity  . Alcohol use: Yes    Alcohol/week: 7.0 standard drinks    Types: 7 Standard drinks or equivalent per week    Comment: 1 drink/day, wine or liquor  . Drug use: Yes    Frequency: 7.0 times per week    Types: Marijuana  . Sexual activity: Yes    Partners: Male    Birth control/protection: Implant  Other Topics Concern  . Not on file  Social History Narrative   No regular exercise.    Social Determinants of Health   Financial Resource Strain:   . Difficulty of Paying Living Expenses: Not on file  Food Insecurity:   . Worried About Programme researcher, broadcasting/film/video in the Last Year: Not on file  . Ran Out of Food in the Last Year: Not on file  Transportation Needs:   . Lack of Transportation (Medical): Not on file  . Lack of Transportation (Non-Medical): Not on file  Physical Activity:   . Days  of Exercise per Week: Not on file  . Minutes of Exercise per Session: Not on file  Stress:   . Feeling of Stress : Not on file  Social Connections:   . Frequency of Communication with Friends and Family: Not on file  . Frequency of Social Gatherings with Friends and Family: Not on file  . Attends Religious Services: Not on file  . Active Member of Clubs or Organizations: Not on file  . Attends Banker Meetings: Not on file  . Marital Status: Not on file  Intimate Partner Violence:   . Fear of Current or Ex-Partner: Not on file  . Emotionally Abused: Not on file  . Physically Abused: Not on file  . Sexually Abused: Not on file    Outpatient Medications Prior to Visit  Medication Sig Dispense Refill  . gabapentin (NEURONTIN) 100 MG capsule You may take 1 capsule (100mg ) up to every 8 hours during the day and up to 3 capsules (300mg ) at bedtime for leg pain. 120 capsule 0  . meloxicam (MOBIC) 15 MG tablet Take 1 tablet (15 mg total) by mouth daily. 30 tablet 3  . norgestimate-ethinyl estradiol (ORTHO-CYCLEN, 28,) 0.25-35 MG-MCG tablet Take 1  tablet by mouth daily. 84 tablet 3   No facility-administered medications prior to visit.    No Known Allergies  ROS Review of Systems    Objective:    Physical Exam  BP 124/88   Pulse 76   SpO2 100%  Wt Readings from Last 3 Encounters:  06/20/20 251 lb (113.9 kg)  05/29/20 245 lb (111.1 kg)  04/10/20 233 lb 6.4 oz (105.9 kg)     Health Maintenance Due  Topic Date Due  . COVID-19 Vaccine (2 - Moderna 2-dose series) 06/06/2020    There are no preventive care reminders to display for this patient.  No results found for: TSH Lab Results  Component Value Date   WBC CANCELED 04/10/2020   HGB 12.3 05/22/2019   HCT 38.9 05/22/2019   MCV 81.6 05/22/2019   PLT 296 05/22/2019   Lab Results  Component Value Date   NA 139 05/22/2019   K 4.0 05/22/2019   CO2 27 05/22/2019   GLUCOSE CANCELED 04/10/2020   BUN 9  05/22/2019   CREATININE 0.96 05/22/2019   BILITOT 0.5 05/22/2019   ALKPHOS 53 06/23/2012   AST 13 05/22/2019   ALT 11 05/22/2019   PROT 6.7 05/22/2019   ALBUMIN 4.1 06/23/2012   CALCIUM 9.3 05/22/2019   Lab Results  Component Value Date   CHOL 116 05/22/2019   Lab Results  Component Value Date   HDL 48 (L) 05/22/2019   Lab Results  Component Value Date   LDLCALC CANCELED 04/10/2020   Lab Results  Component Value Date   TRIG 49 05/22/2019   Lab Results  Component Value Date   CHOLHDL 2.4 05/22/2019   No results found for: HGBA1C    Assessment & Plan:  Screening TB - Patient tolerated injection well without complications. Patient advised to schedule next injection 2-3 days from today.    Problem List Items Addressed This Visit    None    Visit Diagnoses    Screening-pulmonary TB    -  Primary   Relevant Orders   PPD (Completed)      No orders of the defined types were placed in this encounter.   Follow-up: Return in about 2 years (around 08/05/2022), or PPD read.Earna Coder, Janalyn Harder, CMA

## 2020-08-07 ENCOUNTER — Ambulatory Visit (INDEPENDENT_AMBULATORY_CARE_PROVIDER_SITE_OTHER): Payer: Self-pay | Admitting: Family Medicine

## 2020-08-07 ENCOUNTER — Other Ambulatory Visit: Payer: Self-pay

## 2020-08-07 VITALS — BP 129/89

## 2020-08-07 DIAGNOSIS — Z111 Encounter for screening for respiratory tuberculosis: Secondary | ICD-10-CM

## 2020-08-07 LAB — TB SKIN TEST
Induration: 0 mm
TB Skin Test: NEGATIVE

## 2020-08-07 NOTE — Progress Notes (Signed)
.  cmgree  

## 2020-08-07 NOTE — Progress Notes (Signed)
Established Patient Office Visit  Subjective:  Patient ID: Megan Grant, female    DOB: 02/01/1996  Age: 24 y.o. MRN: [id number]  CC:  Chief Complaint  Patient presents with  . PPD Reading    HPI [name] presents for PPD read.   Past Medical History:  Diagnosis Date  . Anemia 07/09/2010   Qualifier: Diagnosis of  By: Linford Arnold MD, Santina Evans    . Episodic mood disorder (HCC) 03/05/2009   Qualifier: Diagnosis of  By: Linford Arnold MD, Santina Evans      Past Surgical History:  Procedure Laterality Date  . WISDOM TOOTH EXTRACTION      Family History  Problem Relation Age of Onset  . Hypertension Mother   . Diabetes Father   . Stroke Father     Social History   Socioeconomic History  . Marital status: Single    Spouse name: Not on file  . Number of children: Not on file  . Years of education: Not on file  . Highest education level: Not on file  Occupational History  . Occupation: Student/Work    Comment: Radio broadcast assistant  Tobacco Use  . Smoking status: Never Smoker  . Smokeless tobacco: Never Used  Vaping Use  . Vaping Use: Never used  Substance and Sexual Activity  . Alcohol use: Yes    Alcohol/week: 7.0 standard drinks    Types: 7 Standard drinks or equivalent per week    Comment: 1 drink/day, wine or liquor  . Drug use: Yes    Frequency: 7.0 times per week    Types: Marijuana  . Sexual activity: Yes    Partners: Male    Birth control/protection: Implant  Other Topics Concern  . Not on file  Social History Narrative   No regular exercise.    Social Determinants of Health   Financial Resource Strain:   . Difficulty of Paying Living Expenses: Not on file  Food Insecurity:   . Worried About Programme researcher, broadcasting/film/video in the Last Year: Not on file  . Ran Out of Food in the Last Year: Not on file  Transportation Needs:   . Lack of Transportation (Medical): Not on file  . Lack of Transportation (Non-Medical): Not on file  Physical Activity:   . Days of  Exercise per Week: Not on file  . Minutes of Exercise per Session: Not on file  Stress:   . Feeling of Stress : Not on file  Social Connections:   . Frequency of Communication with Friends and Family: Not on file  . Frequency of Social Gatherings with Friends and Family: Not on file  . Attends Religious Services: Not on file  . Active Member of Clubs or Organizations: Not on file  . Attends Banker Meetings: Not on file  . Marital Status: Not on file  Intimate Partner Violence:   . Fear of Current or Ex-Partner: Not on file  . Emotionally Abused: Not on file  . Physically Abused: Not on file  . Sexually Abused: Not on file    Outpatient Medications Prior to Visit  Medication Sig Dispense Refill  . gabapentin (NEURONTIN) 100 MG capsule You may take 1 capsule (100mg ) up to every 8 hours during the day and up to 3 capsules (300mg ) at bedtime for leg pain. 120 capsule 0  . meloxicam (MOBIC) 15 MG tablet Take 1 tablet (15 mg total) by mouth daily. 30 tablet 3  . norgestimate-ethinyl estradiol (ORTHO-CYCLEN, 28,) 0.25-35 MG-MCG tablet Take 1  tablet by mouth daily. 84 tablet 3   No facility-administered medications prior to visit.    No Known Allergies  ROS Review of Systems    Objective:    Physical Exam  BP 129/89   SpO2 100%  Wt Readings from Last 3 Encounters:  06/20/20 251 lb (113.9 kg)  05/29/20 245 lb (111.1 kg)  04/10/20 233 lb 6.4 oz (105.9 kg)     Health Maintenance Due  Topic Date Due  . COVID-19 Vaccine (2 - Moderna 2-dose series) 06/06/2020    There are no preventive care reminders to display for this patient.  No results found for: TSH Lab Results  Component Value Date   WBC CANCELED 04/10/2020   HGB 12.3 05/22/2019   HCT 38.9 05/22/2019   MCV 81.6 05/22/2019   PLT 296 05/22/2019   Lab Results  Component Value Date   NA 139 05/22/2019   K 4.0 05/22/2019   CO2 27 05/22/2019   GLUCOSE CANCELED 04/10/2020   BUN 9 05/22/2019    CREATININE 0.96 05/22/2019   BILITOT 0.5 05/22/2019   ALKPHOS 53 06/23/2012   AST 13 05/22/2019   ALT 11 05/22/2019   PROT 6.7 05/22/2019   ALBUMIN 4.1 06/23/2012   CALCIUM 9.3 05/22/2019   Lab Results  Component Value Date   CHOL 116 05/22/2019   Lab Results  Component Value Date   HDL 48 (L) 05/22/2019   Lab Results  Component Value Date   LDLCALC CANCELED 04/10/2020   Lab Results  Component Value Date   TRIG 49 05/22/2019   Lab Results  Component Value Date   CHOLHDL 2.4 05/22/2019   No results found for: HGBA1C    Assessment & Plan:  PPD read - Negative 0 mm  Problem List Items Addressed This Visit    None    Visit Diagnoses    Screening-pulmonary TB    -  Primary      No orders of the defined types were placed in this encounter.   Follow-up: No follow-ups on file.    Esmond Harps, CMA

## 2020-09-24 ENCOUNTER — Ambulatory Visit: Payer: Medicaid Other | Admitting: Family Medicine

## 2020-09-25 ENCOUNTER — Ambulatory Visit (INDEPENDENT_AMBULATORY_CARE_PROVIDER_SITE_OTHER): Payer: Self-pay | Admitting: Family Medicine

## 2020-09-25 ENCOUNTER — Other Ambulatory Visit: Payer: Self-pay

## 2020-09-25 ENCOUNTER — Encounter: Payer: Self-pay | Admitting: Family Medicine

## 2020-09-25 VITALS — BP 136/84 | HR 68 | Ht 69.0 in | Wt 255.0 lb

## 2020-09-25 DIAGNOSIS — N939 Abnormal uterine and vaginal bleeding, unspecified: Secondary | ICD-10-CM

## 2020-09-25 DIAGNOSIS — R110 Nausea: Secondary | ICD-10-CM

## 2020-09-25 DIAGNOSIS — R1012 Left upper quadrant pain: Secondary | ICD-10-CM

## 2020-09-25 LAB — POCT URINE PREGNANCY: Preg Test, Ur: NEGATIVE

## 2020-09-25 NOTE — Progress Notes (Signed)
Pt reports that she began to bleed heavily on yesterday. She did notice some clots. She said that she continues to have the same amount of bleeding today however, the clots have not been as large as they were on yesterday.  She also c/o some dizziness on yesterday non today, frontal headache, and L sided upper quadrant pain (under L breast) that also started yesterday. She describes the pain as sharp and crampy. She did try Advil for the pain this did not help.   She is sexually active and is not using condoms. She ran out of her birth control a couple of months ago. She took a home pregnancy test this was negative.   Denies f/s/c/n/v/d.

## 2020-09-25 NOTE — Progress Notes (Signed)
Established Patient Office Visit  Subjective:  Patient ID: Megan Grant, female    DOB: 18-May-1996  Age: 25 y.o. MRN: [id number]  CC:  Chief Complaint  Patient presents with  . Menorrhagia  . Abdominal Pain    HPI Megan Grant presents for Pt reports that she began to bleed heavily on yesterday. She did notice some clots. She said that she continues to have the same amount of bleeding today however, the clots have not been as large as they were on yesterday. She also c/o some dizziness on yesterday non today, frontal headache, and L sided upper quadrant pain (under L breast) she describes as sharp and crampy that also started yesterday. She describes the pain as sharp and crampy. She did try Advil for the pain this did not help. No ST.  No constipation. No major dietary changes. No vomiting.  No fever or chills.  Her pain about a 7 out of 10.  She is sexually active and is not using condoms. She ran out of her birth control a couple of months ago. She took a home pregnancy test this was negative.   Denies f/s/c/n/v/d.   Past Medical History:  Diagnosis Date  . Anemia 07/09/2010   Qualifier: Diagnosis of  By: Linford Arnold MD, Santina Evans    . Episodic mood disorder (HCC) 03/05/2009   Qualifier: Diagnosis of  By: Linford Arnold MD, Santina Evans      Past Surgical History:  Procedure Laterality Date  . WISDOM TOOTH EXTRACTION      Family History  Problem Relation Age of Onset  . Hypertension Mother   . Diabetes Father   . Stroke Father     Social History   Socioeconomic History  . Marital status: Single    Spouse name: Not on file  . Number of children: Not on file  . Years of education: Not on file  . Highest education level: Not on file  Occupational History  . Occupation: Student/Work    Comment: Radio broadcast assistant  Tobacco Use  . Smoking status: Never Smoker  . Smokeless tobacco: Never Used  Vaping Use  . Vaping Use: Never used  Substance and Sexual Activity  . Alcohol  use: Yes    Alcohol/week: 7.0 standard drinks    Types: 7 Standard drinks or equivalent per week    Comment: 1 drink/day, wine or liquor  . Drug use: Yes    Frequency: 7.0 times per week    Types: Marijuana  . Sexual activity: Yes    Partners: Male    Birth control/protection: Implant  Other Topics Concern  . Not on file  Social History Narrative   No regular exercise.    Social Determinants of Health   Financial Resource Strain: Not on file  Food Insecurity: Not on file  Transportation Needs: Not on file  Physical Activity: Not on file  Stress: Not on file  Social Connections: Not on file  Intimate Partner Violence: Not on file    Outpatient Medications Prior to Visit  Medication Sig Dispense Refill  . norgestimate-ethinyl estradiol (ORTHO-CYCLEN, 28,) 0.25-35 MG-MCG tablet Take 1 tablet by mouth daily. 84 tablet 3  . gabapentin (NEURONTIN) 100 MG capsule You may take 1 capsule (100mg ) up to every 8 hours during the day and up to 3 capsules (300mg ) at bedtime for leg pain. 120 capsule 0  . meloxicam (MOBIC) 15 MG tablet Take 1 tablet (15 mg total) by mouth daily. 30 tablet 3   No facility-administered medications  prior to visit.    No Known Allergies  ROS Review of Systems    Objective:    Physical Exam Vitals reviewed.  Constitutional:      Appearance: She is well-developed and well-nourished.  HENT:     Head: Normocephalic and atraumatic.  Eyes:     Extraocular Movements: EOM normal.     Conjunctiva/sclera: Conjunctivae normal.  Cardiovascular:     Rate and Rhythm: Normal rate.  Pulmonary:     Effort: Pulmonary effort is normal.  Abdominal:     General: Bowel sounds are normal.     Palpations: Abdomen is soft.     Tenderness: There is abdominal tenderness.     Comments: Very TTP in the LUQ   Skin:    General: Skin is dry.     Coloration: Skin is not pale.  Neurological:     Mental Status: She is alert and oriented to person, place, and time.   Psychiatric:        Mood and Affect: Mood and affect normal.        Behavior: Behavior normal.     BP 136/84   Pulse 68   Ht 5\' 9"  (1.753 m)   Wt 255 lb (115.7 kg)   LMP 07/22/2020 (Exact Date)   SpO2 100%   BMI 37.66 kg/m  Wt Readings from Last 3 Encounters:  09/25/20 255 lb (115.7 kg)  06/20/20 251 lb (113.9 kg)  05/29/20 245 lb (111.1 kg)     There are no preventive care reminders to display for this patient.  There are no preventive care reminders to display for this patient.  No results found for: TSH Lab Results  Component Value Date   WBC CANCELED 04/10/2020   HGB 12.3 05/22/2019   HCT 38.9 05/22/2019   MCV 81.6 05/22/2019   PLT 296 05/22/2019   Lab Results  Component Value Date   NA 139 05/22/2019   K 4.0 05/22/2019   CO2 27 05/22/2019   GLUCOSE CANCELED 04/10/2020   BUN 9 05/22/2019   CREATININE 0.96 05/22/2019   BILITOT 0.5 05/22/2019   ALKPHOS 53 06/23/2012   AST 13 05/22/2019   ALT 11 05/22/2019   PROT 6.7 05/22/2019   ALBUMIN 4.1 06/23/2012   CALCIUM 9.3 05/22/2019   Lab Results  Component Value Date   CHOL 116 05/22/2019   Lab Results  Component Value Date   HDL 48 (L) 05/22/2019   Lab Results  Component Value Date   LDLCALC CANCELED 04/10/2020   Lab Results  Component Value Date   TRIG 49 05/22/2019   Lab Results  Component Value Date   CHOLHDL 2.4 05/22/2019   No results found for: HGBA1C    Assessment & Plan:   Problem List Items Addressed This Visit   None   Visit Diagnoses    Episode of heavy vaginal bleeding    -  Primary   Relevant Orders   POCT urine pregnancy (Completed)   CBC with Differential/Platelet   Lipase   COMPLETE METABOLIC PANEL WITH GFR   B-HCG Quant   Epstein-Barr virus VCA antibody panel   C. trachomatis/N. gonorrhoeae RNA   LUQ pain       Relevant Orders   CBC with Differential/Platelet   Lipase   COMPLETE METABOLIC PANEL WITH GFR   B-HCG Quant   Epstein-Barr virus VCA antibody panel    C. trachomatis/N. gonorrhoeae RNA   Nausea       Relevant Orders   CBC with  Differential/Platelet   Lipase   COMPLETE METABOLIC PANEL WITH GFR   B-HCG Quant   Epstein-Barr virus VCA antibody panel      LUQ pain - unclear etiology. Check for pancreatitis.  Consenter spleen enlargement.  Also check for mono.  Her pain is really too high to be menstrual pain.  She did a home pregnancy test and it was negative and urine test here was also negative.  She has had a little bit of nausea but no vomiting or constipation.  We will call with results once available.  She did try some ibuprofen but really did not Get a lot of relief.  Heavy bleeding-she has been off her birth control for couple months after having an IUD in place.  We will go ahead and refill her birth control so that she can restart it.  This should help with the menorrhagia.  We will also do stat STI testing.  Pap smear was performed last year and is up-to-date.  Offered to do pelvic exam but she did not feel comfortable with it today.  No orders of the defined types were placed in this encounter.   Follow-up: No follow-ups on file.    Beatrice Lecher, MD

## 2020-09-26 LAB — COMPLETE METABOLIC PANEL WITH GFR
AG Ratio: 1.5 (calc) (ref 1.0–2.5)
ALT: 18 U/L (ref 6–29)
AST: 22 U/L (ref 10–30)
Albumin: 4.3 g/dL (ref 3.6–5.1)
Alkaline phosphatase (APISO): 50 U/L (ref 31–125)
BUN: 7 mg/dL (ref 7–25)
CO2: 29 mmol/L (ref 20–32)
Calcium: 9.5 mg/dL (ref 8.6–10.2)
Chloride: 107 mmol/L (ref 98–110)
Creat: 0.94 mg/dL (ref 0.50–1.10)
GFR, Est African American: 97 mL/min/{1.73_m2} (ref >=60)
GFR, Est Non African American: 84 mL/min/{1.73_m2} (ref >=60)
Globulin: 2.8 g/dL (calc) (ref 1.9–3.7)
Glucose, Bld: 84 mg/dL (ref 65–139)
Potassium: 4.4 mmol/L (ref 3.5–5.3)
Sodium: 142 mmol/L (ref 135–146)
Total Bilirubin: 0.4 mg/dL (ref 0.2–1.2)
Total Protein: 7.1 g/dL (ref 6.1–8.1)

## 2020-09-26 LAB — CBC WITH DIFFERENTIAL/PLATELET
Absolute Monocytes: 495 cells/uL (ref 200–950)
Basophils Absolute: 38 cells/uL (ref 0–200)
Basophils Relative: 0.5 %
Eosinophils Absolute: 90 cells/uL (ref 15–500)
Eosinophils Relative: 1.2 %
HCT: 42.5 % (ref 35.0–45.0)
Hemoglobin: 13.7 g/dL (ref 11.7–15.5)
Lymphs Abs: 2490 cells/uL (ref 850–3900)
MCH: 26.1 pg — ABNORMAL LOW (ref 27.0–33.0)
MCHC: 32.2 g/dL (ref 32.0–36.0)
MCV: 81.1 fL (ref 80.0–100.0)
MPV: 11 fL (ref 7.5–12.5)
Monocytes Relative: 6.6 %
Neutro Abs: 4388 cells/uL (ref 1500–7800)
Neutrophils Relative %: 58.5 %
Platelets: 302 10*3/uL (ref 140–400)
RBC: 5.24 10*6/uL — ABNORMAL HIGH (ref 3.80–5.10)
RDW: 12.3 % (ref 11.0–15.0)
Total Lymphocyte: 33.2 %
WBC: 7.5 10*3/uL (ref 3.8–10.8)

## 2020-09-26 LAB — EPSTEIN-BARR VIRUS VCA ANTIBODY PANEL
EBV NA IgG: >600 U/mL — ABNORMAL HIGH
EBV VCA IgG: >750 U/mL — ABNORMAL HIGH
EBV VCA IgM: <36 U/mL

## 2020-09-26 LAB — HCG, QUANTITATIVE, PREGNANCY: HCG, Total, QN: 6 m[IU]/mL

## 2020-09-26 LAB — LIPASE: Lipase: 17 U/L (ref 7–60)

## 2020-09-27 LAB — C. TRACHOMATIS/N. GONORRHOEAE RNA
C. trachomatis RNA, TMA: NOT DETECTED
N. gonorrhoeae RNA, TMA: NOT DETECTED

## 2020-10-28 ENCOUNTER — Ambulatory Visit (INDEPENDENT_AMBULATORY_CARE_PROVIDER_SITE_OTHER): Payer: Self-pay | Admitting: Family Medicine

## 2020-10-28 ENCOUNTER — Other Ambulatory Visit: Payer: Self-pay

## 2020-10-28 DIAGNOSIS — Z111 Encounter for screening for respiratory tuberculosis: Secondary | ICD-10-CM

## 2020-10-28 NOTE — Progress Notes (Signed)
Agree with documentation as above.   Sharvi Mooneyhan, MD  

## 2020-10-28 NOTE — Progress Notes (Signed)
PPD placed left forearm. No complications.

## 2020-10-30 ENCOUNTER — Other Ambulatory Visit: Payer: Self-pay

## 2020-10-30 ENCOUNTER — Ambulatory Visit (INDEPENDENT_AMBULATORY_CARE_PROVIDER_SITE_OTHER): Payer: Self-pay | Admitting: Family Medicine

## 2020-10-30 VITALS — BP 133/86 | HR 110

## 2020-10-30 DIAGNOSIS — Z111 Encounter for screening for respiratory tuberculosis: Secondary | ICD-10-CM

## 2020-10-30 NOTE — Progress Notes (Signed)
Agree with documentation as above.   Catherine Metheney, MD  

## 2020-10-30 NOTE — Progress Notes (Signed)
Patient is here for PPD read.   Results were Negative 0 mm. Forms completed. Copy given to patient, copy to scan, and copy to hold.

## 2021-04-24 ENCOUNTER — Other Ambulatory Visit: Payer: Self-pay

## 2021-04-24 MED ORDER — NORGESTIMATE-ETH ESTRADIOL 0.25-35 MG-MCG PO TABS: 1.0000 | ORAL_TABLET | Freq: Every day | ORAL | 1 refills | Status: DC

## 2021-04-24 NOTE — Telephone Encounter (Signed)
Pt called requesting refill of Orth Cyclen.  Refill sent to pharmacy.  Tiajuana Amass, CMA

## 2021-04-27 ENCOUNTER — Ambulatory Visit: Payer: Medicaid Other | Admitting: Family Medicine

## 2021-06-23 ENCOUNTER — Ambulatory Visit (INDEPENDENT_AMBULATORY_CARE_PROVIDER_SITE_OTHER): Payer: Self-pay | Admitting: Family Medicine

## 2021-06-23 VITALS — BP 126/88 | HR 82 | Temp 98.0°F

## 2021-06-23 DIAGNOSIS — Z111 Encounter for screening for respiratory tuberculosis: Secondary | ICD-10-CM

## 2021-06-23 NOTE — Progress Notes (Signed)
Patient presents today for PPD placement to screen for tuberculosis. Patient does not have any paperwork that needs to be completed for this screening.    Patient denies CP, palpitations, ShOB, abd pain, headache, mood swings.   PPD administered ID in right arm. Pt tolerated administration well and without complications.   Patient instructed to return to the clinic in 48-72 hours, which would be on 06/25/2020 after 2:00 PM. Patient verbalized understanding. Instructed patient to stop at check out to schedule a nurse visit on Thursday after 2:pp PM or on Friday before 2:00 PM for reading. Pt verbalized understanding.

## 2021-06-23 NOTE — Patient Instructions (Signed)
Tuberculin Skin Test Why am I having this test? The tuberculin skin test is used to check whether a person has been exposed to the bacteria that causes tuberculosis (TB) (Mycobacterium tuberculosis). Tuberculosis is a bacterial infection that usually affects the lungs but can affect other parts of the body. You may have a tuberculin skin test if: You have possible symptoms of TB, such as: Coughing up blood, mucus from the lungs (sputum), or both. A cough that lasts three weeks or longer. Chest pain, or pain while breathing or coughing. Unexplained weight loss. Fatigue and weakness. Fever, sweating, and chills. Loss of appetite. You are at high risk for getting TB. You may be at high risk if you: Inject illegal drugs or share needles. Have HIV or other diseases that affect the body's disease-fighting system (immune system). Work in a health care facility. Live in a high-risk community, such as a homeless shelter, nursing home, or correctional facility. Have had contact with someone who has TB. Are from or have traveled to a country where TB is common. If you are at high risk, you may need to have regular TB screenings. TB screening may be required when starting a new job, such as becoming a health care worker or a teacher. Colleges or universities may require TB screening fornew students. What is being tested? This test checks for the presence of TB antibodies in the body. Antibodies are part of the body's immune system. After you get an infection, your body makes antibodies that stay in your body after you recover and protect you fromgetting the same infection again. Tell a health care provider about: Any allergies you have. All medicines you are taking, including vitamins, herbs, eye drops, creams, and over-the-counter medicines. Any blood disorders you have. Any surgeries you have had. Any medical conditions you have. Whether you are pregnant or may be pregnant. What happens during the  test?  Your health care provider will inject a solution called PPD (purified protein derivative) under the first layer of skin on your arm. This causes a small, blister-like bump to form over the area temporarily. PPD is made from the bacteria that causes TB. PPD causes your immune system to react, but it does not get you sickwith TB. You may feel mild stinging as this happens. Afterward, the area may itch orburn. How are the results reported? To get your test results, you will need to see your health care provider again within 2-3 days after you received the injection. It is important to follow your health care provider's instructions about when to be seen again. If you are not seen within 2-3 days, you may need to have the test repeated. At your follow-up visit, your health care provider will measure the area where the PPD was injected to see if the bump has gotten larger due to swelling. Your results will be reported as positive or negative. If the bump has disappeared or is small, your test result is negative. Negative means that you do not have the antibodies. If the bump is large, your test result is positive. Positive means that you have the antibodies. Swelling is caused by the antibodies reacting with the PPD. The skin may also turn red around the bump. A false-positive result can occur. A false positive is incorrect because itmeans that a condition is present when it is not. A false-negative result can occur. A false negative is incorrect because it means that a condition is not present when it is. False negatives are   rare and are more likely to occur in older people and in people who have weakened immunesystems. What do the results mean? A negative result means that it is unlikely that you have TB or that you have been exposed to TB bacteria. This test may be repeated, or you may have a blood test to check for TB. This is because your body may not react to the tuberculinskin test until several  weeks after exposure to TB bacteria. A positive result means that you have been exposed to TB, and you may need more tests to determine if you have: Active TB, also called TB disease. This means that you have TB symptoms and your infection can spread to others (you are contagious). Latent TB. This means that you do not have any symptoms of TB and you are not contagious. Latent TB can turn into active TB. Talk with your health care provider about what your results mean. Questions to ask your health care provider Ask your health care provider, or the department that is doing the test: When will my results be ready? How will I get my results? What are my treatment options? What other tests do I need? What are my next steps? Summary The tuberculin skin test is used to check whether a person has been exposed to the bacteria that causes tuberculosis (TB). Your health care provider will inject a solution known as PPD (purified protein derivative) under the first layer of skin on your arm. After 2-3 days, your health care provider will measure the area where the PPD was injected to see if the bump has gotten larger due to swelling. Your results will be reported as positive or negative. A positive result means that you have been exposed to TB. A negative result means that it is unlikely that you have TB or that you have been exposed to TB bacteria. This information is not intended to replace advice given to you by your health care provider. Make sure you discuss any questions you have with your healthcare provider. Document Revised: 05/29/2020 Document Reviewed: 05/08/2020 Elsevier Patient Education  2022 [employer]  

## 2021-06-24 ENCOUNTER — Ambulatory Visit: Payer: Medicaid Other

## 2021-06-25 ENCOUNTER — Ambulatory Visit (INDEPENDENT_AMBULATORY_CARE_PROVIDER_SITE_OTHER): Payer: Self-pay | Admitting: Family Medicine

## 2021-06-25 DIAGNOSIS — Z111 Encounter for screening for respiratory tuberculosis: Secondary | ICD-10-CM

## 2021-06-25 LAB — TB SKIN TEST
Induration: 0 mm
TB Skin Test: NEGATIVE

## 2021-06-25 NOTE — Progress Notes (Addendum)
Patient presents today for PPD reading. PPD was placed on 06/23/2021 in right forearm.   Results: negative 0 induration

## 2021-07-29 ENCOUNTER — Telehealth: Payer: Self-pay | Admitting: General Practice

## 2021-07-29 NOTE — Telephone Encounter (Signed)
Transition Care Management Follow-up Telephone Call Date of discharge and from where: 07/28/21 from [facility] How have you been since you were released from the hospital? She got some shots while she was in the ER and she is feeling much better. Any questions or concerns? No  Items Reviewed: Did the pt receive and understand the discharge instructions provided? Yes  Medications obtained and verified? Yes  Other? No  Any new allergies since your discharge? No  Dietary orders reviewed? Yes Do you have support at home? Yes   Home Care and Equipment/Supplies: Were home health services ordered? no  Functional Questionnaire: (I = Independent and D = Dependent) ADLs: I  Bathing/Dressing- I  Meal Prep- I  Eating- I  Maintaining continence- I  Transferring/Ambulation- I  Managing Meds- I  Follow up appointments reviewed:  PCP Hospital f/u appt confirmed?  Patient stated that she is doing better and will call back as needed.   Specialist Hospital f/u appt confirmed? No   Are transportation arrangements needed? No  If their condition worsens, is the pt aware to call PCP or go to the Emergency Dept.? Yes Was the patient provided with contact information for the PCP's office or ED? Yes Was to pt encouraged to call back with questions or concerns? Yes

## 2022-06-01 DIAGNOSIS — A630 Anogenital (venereal) warts: Secondary | ICD-10-CM | POA: Insufficient documentation

## 2022-10-26 ENCOUNTER — Ambulatory Visit (INDEPENDENT_AMBULATORY_CARE_PROVIDER_SITE_OTHER): Payer: No Typology Code available for payment source | Admitting: Family Medicine

## 2022-10-26 ENCOUNTER — Encounter: Payer: Self-pay | Admitting: Family Medicine

## 2022-10-26 ENCOUNTER — Other Ambulatory Visit (HOSPITAL_COMMUNITY)
Admission: RE | Admit: 2022-10-26 | Discharge: 2022-10-26 | Disposition: A | Discharge status: Home / Self Care | Payer: No Typology Code available for payment source | Source: Ambulatory Visit | Attending: Family Medicine | Admitting: Family Medicine

## 2022-10-26 VITALS — BP 126/87 | HR 84 | Ht 69.0 in | Wt 279.0 lb

## 2022-10-26 DIAGNOSIS — Z124 Encounter for screening for malignant neoplasm of cervix: Secondary | ICD-10-CM | POA: Diagnosis not present

## 2022-10-26 DIAGNOSIS — Z01419 Encounter for gynecological examination (general) (routine) without abnormal findings: Secondary | ICD-10-CM | POA: Diagnosis not present

## 2022-10-26 DIAGNOSIS — Z0000 Encounter for general adult medical examination without abnormal findings: Secondary | ICD-10-CM | POA: Diagnosis not present

## 2022-10-26 NOTE — Progress Notes (Signed)
Complete physical exam  Patient: Megan Grant   DOB: November 16, 1995   26 y.o. Female  MRN: [id number]  Subjective:    Chief Complaint  Patient presents with   Annual Exam    Megan Grant is a 27 y.o. female who presents today for a complete physical exam. She reports consuming a general diet. The patient does not participate in regular exercise at present. She generally feels well.  She does not have additional problems to discuss today.    Most recent fall risk assessment:    04/10/2020    9:01 AM  [facility] in the past year? 0  Follow up Falls evaluation completed     Most recent depression screenings:    04/10/2020    9:01 AM 05/22/2019    8:47 AM  PHQ 2/9 Scores  PHQ - 2 Score 0 0  PHQ- 9 Score 4       Past Surgical History:  Procedure Laterality Date   WISDOM TOOTH EXTRACTION     Social History   Tobacco Use   Smoking status: Never   Smokeless tobacco: Never  Vaping Use   Vaping Use: Never used  Substance Use Topics   Alcohol use: Yes    Alcohol/week: 7.0 standard drinks of alcohol    Types: 7 Standard drinks or equivalent per week    Comment: 1 drink/day, wine or liquor   Drug use: Yes    Frequency: 7.0 times per week    Types: Marijuana      Patient Care Team: Hali Marry, MD as PCP - General   Outpatient Medications Prior to Visit  Medication Sig   [DISCONTINUED] norgestimate-ethinyl estradiol (ORTHO-CYCLEN, 28,) 0.25-35 MG-MCG tablet Take 1 tablet by mouth daily.   No facility-administered medications prior to visit.    ROS        Objective:     BP 126/87   Pulse 84   Ht 5\' 9"  (1.753 m)   Wt 279 lb (126.6 kg)   LMP 10/12/2022 (Exact Date)   SpO2 97%   BMI 41.20 kg/m     Physical Exam Vitals and nursing note reviewed. Exam conducted with a chaperone present.  Constitutional:      Appearance: She is well-developed.  HENT:     Head: Normocephalic and atraumatic.     Right Ear: Tympanic membrane, ear  canal and external ear normal.     Left Ear: Tympanic membrane, ear canal and external ear normal.     Nose: Nose normal.  Eyes:     Conjunctiva/sclera: Conjunctivae normal.     Pupils: Pupils are equal, round, and reactive to light.  Neck:     Thyroid: No thyromegaly.  Cardiovascular:     Rate and Rhythm: Normal rate and regular rhythm.     Heart sounds: Normal heart sounds.  Pulmonary:     Effort: Pulmonary effort is normal.     Breath sounds: Normal breath sounds. No wheezing.  Abdominal:     General: Bowel sounds are normal.     Palpations: Abdomen is soft.  Genitourinary:    General: Normal vulva.     Labia:        Right: No rash.        Left: No rash.      Vagina: Normal.     Cervix: Normal.     Uterus: Normal.      Adnexa: Right adnexa normal and left adnexa normal.  Musculoskeletal:  Cervical back: Neck supple.  Lymphadenopathy:     Cervical: No cervical adenopathy.  Skin:    General: Skin is warm and dry.  Neurological:     Mental Status: She is alert and oriented to person, place, and time.  Psychiatric:        Behavior: Behavior normal.      No results found for any visits on 10/26/22.      Assessment & Plan:    Routine Health Maintenance and Physical Exam  Immunization History  Administered Date(s) Administered   DTaP 06/14/1994, 08/16/1994, 11/18/1994, 11/23/1995, 04/21/1999   HIB (PRP-OMP) 06/14/1994, 08/16/1994, 11/18/1994, 11/23/1995   HPV 9-valent 01/18/2017   Hepatitis A 11/05/2005, 05/13/2006   Hepatitis B 06-14-94, 06/14/1994, 11/18/1994   Hpv-Unspecified 11/05/2005, 01/06/2006, 05/13/2006   IPV 06/14/1994, 08/16/1994, 11/18/1994, 04/21/1999   Influenza, Seasonal, Injecte, Preservative Fre 06/30/2017   Influenza,inj,Quad PF,6+ Mos 07/14/2018   Influenza-Unspecified 06/22/2019   MMR 06/30/1995, 04/21/1999   PFIZER(Purple Top)SARS-COV-2 Vaccination 05/22/2020, 06/20/2020   PPD Test 12/26/2012, 06/20/2020, 08/05/2020, 10/28/2020,  06/23/2021   Tdap 11/05/2005, 05/13/2014, 08/30/2016   Varicella 06/30/1995, 11/05/2005    Health Maintenance  Topic Date Due   PAP-Cervical Cytology Screening  05/21/2022   PAP SMEAR-Modifier  05/21/2022   INFLUENZA VACCINE  12/19/2022 (Originally 04/20/2022)   COVID-19 Vaccine (3 - 2023-24 season) 11/12/2023 (Originally 05/21/2022)   DTaP/Tdap/Td (9 - Td or Tdap) 08/30/2026   HPV VACCINES  Completed   Hepatitis C Screening  Completed   HIV Screening  Completed   Pneumococcal Vaccine 37-64 Years old  Aged Out    Discussed health benefits of physical activity, and encouraged her to engage in regular exercise appropriate for her age and condition.  Problem List Items Addressed This Visit   None Visit Diagnoses     Encounter for annual routine gynecological examination    -  Primary   Relevant Orders   COMPLETE METABOLIC PANEL WITH GFR   CBC   Lipid Panel w/reflex Direct LDL   Screening for cervical cancer       Relevant Orders   Cytology - PAP   Wellness examination           Keep up a regular exercise program and make sure you are eating a healthy diet Try to eat 4 servings of dairy a day, or if you are lactose intolerant take a calcium with vitamin D daily.  Your vaccines are up to date.   Return in about 1 year (around 10/27/2023) for Wellness Exam.     Beatrice Lecher, MD

## 2022-10-29 LAB — CYTOLOGY - PAP
Chlamydia: NEGATIVE
Comment: NEGATIVE
Comment: NEGATIVE
Comment: NORMAL
Diagnosis: NEGATIVE
High risk HPV: NEGATIVE
Neisseria Gonorrhea: NEGATIVE

## 2022-10-29 MED ORDER — FLUCONAZOLE 150 MG PO TABS: 150.0000 mg | ORAL_TABLET | Freq: Once | ORAL | 0 refills | Status: AC

## 2022-10-29 NOTE — Addendum Note (Signed)
Addended by: Beatrice Lecher D on: 10/29/2022 04:10 PM   Modules accepted: Orders

## 2022-10-29 NOTE — Progress Notes (Signed)
Hi Megan Grant, Pap smear results are negative which is great so recommend repeat Pap smear in 3 years.  STD testing was negative.  But you did have some presence of yeast.  Some get a send over a tab of Diflucan to clear that up.

## 2023-01-19 NOTE — Progress Notes (Unsigned)
   Acute Office Visit  Subjective:     Patient ID: Megan Grant, female    DOB: [birth year], 27 y.o.   MRN: [id number]  No chief complaint on file.   HPI Patient is in today for ***  ROS      Objective:    There were no vitals taken for this visit. {Vitals History (Optional):23777}  Physical Exam  No results found for any visits on 01/20/23.      Assessment & Plan:   Problem List Items Addressed This Visit   None   No orders of the defined types were placed in this encounter.   No follow-ups on file.  Nani Gasser, MD

## 2023-01-20 ENCOUNTER — Encounter: Payer: Self-pay | Admitting: Family Medicine

## 2023-01-20 ENCOUNTER — Ambulatory Visit (INDEPENDENT_AMBULATORY_CARE_PROVIDER_SITE_OTHER): Payer: No Typology Code available for payment source | Admitting: Family Medicine

## 2023-01-20 VITALS — BP 132/88 | HR 78 | Ht 69.0 in | Wt 270.0 lb

## 2023-01-20 DIAGNOSIS — R300 Dysuria: Secondary | ICD-10-CM

## 2023-01-20 DIAGNOSIS — N898 Other specified noninflammatory disorders of vagina: Secondary | ICD-10-CM

## 2023-01-20 DIAGNOSIS — N760 Acute vaginitis: Secondary | ICD-10-CM | POA: Diagnosis not present

## 2023-01-20 LAB — POCT URINALYSIS DIP (CLINITEK)
Bilirubin, UA: NEGATIVE
Blood, UA: NEGATIVE
Glucose, UA: NEGATIVE mg/dL
Ketones, POC UA: NEGATIVE mg/dL
Leukocytes, UA: NEGATIVE
Nitrite, UA: NEGATIVE
POC PROTEIN,UA: NEGATIVE
Spec Grav, UA: 1.02 (ref 1.010–1.025)
Urobilinogen, UA: 0.2 E.U./dL
pH, UA: 7.5 (ref 5.0–8.0)

## 2023-01-21 LAB — WET PREP FOR TRICH, YEAST, CLUE
MICRO NUMBER:: 14905831
Specimen Quality: ADEQUATE

## 2023-01-21 MED ORDER — SULFAMETHOXAZOLE-TRIMETHOPRIM 800-160 MG PO TABS: 1.0000 | ORAL_TABLET | Freq: Two times a day (BID) | ORAL | 0 refills | Status: DC

## 2023-01-21 NOTE — Progress Notes (Signed)
Hi Megan Grant, your swab was negative for yeast or bacteria. I am dong to send over an antibiotic for possible UTI, though your urine actually looked pretty good but we will see if you feel better if you do not then I recommend further workup with STD testing etc.

## 2023-01-21 NOTE — Addendum Note (Signed)
Addended by: Nani Gasser D on: 01/21/2023 07:38 AM   Modules accepted: Orders

## 2023-01-24 ENCOUNTER — Ambulatory Visit: Payer: No Typology Code available for payment source

## 2023-01-25 ENCOUNTER — Ambulatory Visit (INDEPENDENT_AMBULATORY_CARE_PROVIDER_SITE_OTHER): Payer: Medicaid Other | Admitting: Family Medicine

## 2023-01-25 DIAGNOSIS — Z111 Encounter for screening for respiratory tuberculosis: Secondary | ICD-10-CM | POA: Diagnosis not present

## 2023-01-25 NOTE — Progress Notes (Signed)
Agree with documentation as above.   Quintella Mura, MD  

## 2023-01-25 NOTE — Progress Notes (Signed)
   Established Patient Office Visit  Subjective   Patient ID: Megan Grant, female    DOB: 06-01-1996  Age: 27 y.o. MRN: [id number]  Chief Complaint  Patient presents with   PPD Placement    HPI  Megan Grant is here for PPD placement.   ROS    Objective:     LMP 12/07/2022 (Approximate)    Physical Exam   No results found for any visits on 01/25/23.    The ASCVD Risk score (Arnett DK, et al., 2019) failed to calculate for the following reasons:   The 2019 ASCVD risk score is only valid for ages 34 to 35    Assessment & Plan:  PPD - Tolerated ppd, no complications, will return 48-72 hours for recheck   Problem List Items Addressed This Visit   None Visit Diagnoses     Screening-pulmonary TB    -  Primary   Relevant Orders   PPD (Completed)       Return in about 2 days (around 01/27/2023) for PPD reading. Earna Coder, Janalyn Harder, CMA

## 2023-01-27 ENCOUNTER — Ambulatory Visit (INDEPENDENT_AMBULATORY_CARE_PROVIDER_SITE_OTHER): Payer: Medicaid Other | Admitting: Family Medicine

## 2023-01-27 VITALS — BP 133/96 | HR 72 | Ht 69.0 in

## 2023-01-27 DIAGNOSIS — Z111 Encounter for screening for respiratory tuberculosis: Secondary | ICD-10-CM

## 2023-01-27 LAB — TB SKIN TEST
Induration: 0 mm
TB Skin Test: NEGATIVE

## 2023-01-27 NOTE — Progress Notes (Signed)
   Established Patient Office Visit  Subjective   Patient ID: Megan Grant, female    DOB: 06-Jul-1996  Age: 27 y.o. MRN: [id number]  Chief Complaint  Patient presents with   PPD Reading    Nurse visit PPD reading    HPI  PPD reading nurse visit.  Patient in office for PPD read .  ROS    Objective:     BP (!) 133/96   Pulse 72   Ht 5\' 9"  (1.753 m)   LMP 12/07/2022 (Approximate)   SpO2 100%   BMI 39.87 kg/m    Physical Exam   No results found for any visits on 01/27/23.    The ASCVD Risk score (Arnett DK, et al., 2019) failed to calculate for the following reasons:   The 2019 ASCVD risk score is only valid for ages 51 to 85    Assessment & Plan:  PPD read - negative results- no redness 0mm induration. Patient  will return for the second PPD placement within 7 to 21 days.  Problem List Items Addressed This Visit   None   Return for  return for the second PPD placement within 7 to 21 days. as nurse visit.  Elizabeth Palau, LPN

## 2023-01-27 NOTE — Patient Instructions (Addendum)
return for the second PPD placement within 7 to 21 days as nurse visit.

## 2023-01-27 NOTE — Progress Notes (Signed)
Agree with above. Will completed forms

## 2023-02-07 ENCOUNTER — Ambulatory Visit: Payer: No Typology Code available for payment source

## 2023-05-05 ENCOUNTER — Ambulatory Visit: Payer: BC Managed Care – PPO | Admitting: Family Medicine

## 2023-05-26 ENCOUNTER — Telehealth: Payer: Self-pay | Admitting: Family Medicine

## 2023-05-26 NOTE — Telephone Encounter (Signed)
Patient dropped off document  Health Statement , to be filled out by provider. Patient requested to send it back via Call Patient to pick up within 5-days. Document is located in providers tray at front office.Please advise at [facility] [phone]

## 2023-05-27 ENCOUNTER — Other Ambulatory Visit: Payer: Self-pay | Admitting: Family Medicine

## 2023-06-01 ENCOUNTER — Telehealth: Payer: Self-pay | Admitting: Family Medicine

## 2023-06-01 NOTE — Telephone Encounter (Signed)
Patient called in wanting an update on paperwork. Please advise.

## 2023-06-03 NOTE — Telephone Encounter (Signed)
Is already a separate note open in this regard so I am going to go ahead and close this 1.

## 2023-06-03 NOTE — Telephone Encounter (Signed)
[name], do you have this one?

## 2023-06-03 NOTE — Telephone Encounter (Signed)
I can't fill her forms our without an appt.  It is was too complex.

## 2023-06-03 NOTE — Telephone Encounter (Signed)
Pt called. She is following up on paperwork for physical. She needs it for a job

## 2023-06-03 NOTE — Telephone Encounter (Signed)
I have not

## 2023-06-07 ENCOUNTER — Ambulatory Visit (INDEPENDENT_AMBULATORY_CARE_PROVIDER_SITE_OTHER): Payer: Medicaid Other | Admitting: Family Medicine

## 2023-06-07 ENCOUNTER — Encounter: Payer: Self-pay | Admitting: Family Medicine

## 2023-06-07 VITALS — BP 122/80 | HR 95 | Ht 69.0 in | Wt 267.0 lb

## 2023-06-07 DIAGNOSIS — Z021 Encounter for pre-employment examination: Secondary | ICD-10-CM

## 2023-06-07 NOTE — Progress Notes (Signed)
Established Patient Office Visit  Subjective   Patient ID: Megan Grant, female    DOB: 1996/03/22  Age: 27 y.o. MRN: [id number]  Chief Complaint  Patient presents with   Form Completion    HPI  Had originally dropped off paperwork to be completed for her work. She is a travel CNA.  The forms were in regards to wearing a respirator/Fit testing .  There were multiple pages of questions and so I have asked her to come in to make an appointment so that we can go over it together.  Already had her PPD placed.    ROS    Objective:     BP 122/80   Pulse 95   Ht 5\' 9"  (1.753 m)   Wt 267 lb (121.1 kg)   SpO2 100%   BMI 39.43 kg/m    Physical Exam Constitutional:      Appearance: Normal appearance.  HENT:     Head: Normocephalic and atraumatic.     Right Ear: Tympanic membrane, ear canal and external ear normal. There is no impacted cerumen.     Left Ear: Tympanic membrane, ear canal and external ear normal. There is no impacted cerumen.     Nose: Nose normal.     Mouth/Throat:     Pharynx: Oropharynx is clear.  Eyes:     Conjunctiva/sclera: Conjunctivae normal.  Cardiovascular:     Rate and Rhythm: Normal rate and regular rhythm.  Pulmonary:     Effort: Pulmonary effort is normal.     Breath sounds: Normal breath sounds.  Musculoskeletal:     Cervical back: Neck supple. No tenderness.  Lymphadenopathy:     Cervical: No cervical adenopathy.  Skin:    General: Skin is warm and dry.  Neurological:     Mental Status: She is alert and oriented to person, place, and time.  Psychiatric:        Mood and Affect: Mood normal.      No results found for any visits on 06/07/23.    The ASCVD Risk score (Arnett DK, et al., 2019) failed to calculate for the following reasons:   The 2019 ASCVD risk score is only valid for ages 68 to 67    Assessment & Plan:   Problem List Items Addressed This Visit   None Visit Diagnoses     Encounter for pre-employment  examination    -  Primary      ENT/cardiac, lung exam normal today.  Blood pressure at goal.  Form completed and no concerns regarding her ability to be bit tested or to wear any type of respirator available.  We did complete the form today and made a copy to place in our chart she was given the original to return to her employer.  No follow-ups on file.    Nani Gasser, MD

## 2023-08-20 DIAGNOSIS — L739 Follicular disorder, unspecified: Secondary | ICD-10-CM | POA: Diagnosis not present

## 2023-08-20 DIAGNOSIS — M545 Low back pain, unspecified: Secondary | ICD-10-CM | POA: Diagnosis not present

## 2023-08-20 DIAGNOSIS — M62830 Muscle spasm of back: Secondary | ICD-10-CM | POA: Diagnosis not present

## 2023-08-20 DIAGNOSIS — F1290 Cannabis use, unspecified, uncomplicated: Secondary | ICD-10-CM | POA: Diagnosis not present

## 2023-08-20 DIAGNOSIS — Z202 Contact with and (suspected) exposure to infections with a predominantly sexual mode of transmission: Secondary | ICD-10-CM | POA: Diagnosis not present

## 2023-08-20 DIAGNOSIS — M79604 Pain in right leg: Secondary | ICD-10-CM | POA: Diagnosis not present

## 2023-08-24 DIAGNOSIS — Z113 Encounter for screening for infections with a predominantly sexual mode of transmission: Secondary | ICD-10-CM | POA: Diagnosis not present

## 2023-08-24 DIAGNOSIS — A599 Trichomoniasis, unspecified: Secondary | ICD-10-CM | POA: Diagnosis not present

## 2023-08-24 DIAGNOSIS — Z1330 Encounter for screening examination for mental health and behavioral disorders, unspecified: Secondary | ICD-10-CM | POA: Diagnosis not present

## 2023-08-24 DIAGNOSIS — Z30011 Encounter for initial prescription of contraceptive pills: Secondary | ICD-10-CM | POA: Diagnosis not present

## 2023-08-24 DIAGNOSIS — E6609 Other obesity due to excess calories: Secondary | ICD-10-CM | POA: Diagnosis not present

## 2023-08-24 DIAGNOSIS — E66812 Obesity, class 2: Secondary | ICD-10-CM | POA: Diagnosis not present

## 2023-09-01 DIAGNOSIS — A599 Trichomoniasis, unspecified: Secondary | ICD-10-CM | POA: Diagnosis not present

## 2023-09-01 DIAGNOSIS — Z113 Encounter for screening for infections with a predominantly sexual mode of transmission: Secondary | ICD-10-CM | POA: Diagnosis not present

## 2023-09-02 DIAGNOSIS — M791 Myalgia, unspecified site: Secondary | ICD-10-CM | POA: Diagnosis not present

## 2023-09-02 DIAGNOSIS — J029 Acute pharyngitis, unspecified: Secondary | ICD-10-CM | POA: Diagnosis not present

## 2023-09-02 DIAGNOSIS — Z79899 Other long term (current) drug therapy: Secondary | ICD-10-CM | POA: Diagnosis not present

## 2023-09-02 DIAGNOSIS — J020 Streptococcal pharyngitis: Secondary | ICD-10-CM | POA: Diagnosis not present

## 2023-09-02 DIAGNOSIS — R6883 Chills (without fever): Secondary | ICD-10-CM | POA: Diagnosis not present

## 2023-09-02 DIAGNOSIS — I10 Essential (primary) hypertension: Secondary | ICD-10-CM | POA: Diagnosis not present

## 2023-09-15 ENCOUNTER — Ambulatory Visit: Payer: BC Managed Care – PPO | Admitting: Family Medicine

## 2023-09-16 ENCOUNTER — Ambulatory Visit (INDEPENDENT_AMBULATORY_CARE_PROVIDER_SITE_OTHER): Payer: BC Managed Care – PPO | Admitting: Medical-Surgical

## 2023-09-16 ENCOUNTER — Encounter: Payer: Self-pay | Admitting: Medical-Surgical

## 2023-09-16 VITALS — BP 117/81 | HR 74 | Resp 20 | Ht 69.0 in | Wt 266.1 lb

## 2023-09-16 DIAGNOSIS — N898 Other specified noninflammatory disorders of vagina: Secondary | ICD-10-CM

## 2023-09-16 LAB — POCT URINALYSIS DIP (CLINITEK)
Bilirubin, UA: NEGATIVE
Blood, UA: NEGATIVE
Glucose, UA: NEGATIVE mg/dL
Ketones, POC UA: NEGATIVE mg/dL
Leukocytes, UA: NEGATIVE
Nitrite, UA: NEGATIVE
POC PROTEIN,UA: NEGATIVE
Spec Grav, UA: 1.02 (ref 1.010–1.025)
Urobilinogen, UA: 0.2 U/dL
pH, UA: 5.5 (ref 5.0–8.0)

## 2023-09-16 MED ORDER — FLUCONAZOLE 150 MG PO TABS: 150.0000 mg | ORAL_TABLET | Freq: Once | ORAL | 0 refills | Status: AC

## 2023-09-16 NOTE — Progress Notes (Signed)
        Established patient visit  History, exam, impression, and plan:  1. Vaginal itching (Primary) Pleasant 27 year old female presenting today with reports of 3 days of vaginal itching both on the external and internal aspects.  Has not seen any abnormal discharge.  Denies any abnormal bleeding or dyspareunia.  Itching is intermittent rather than constant.  Does note that she had a yeast infection years ago and the symptoms are similar.  She finished a course of antibiotics approximately 2 weeks ago.  Denies concern for STIs but okay with [place] checking urine for GC chlamydia and trichomonas.  POCT urinalysis normal.  Wet prep sent for further evaluation.  Given her recent antibiotics and current symptoms, treating empirically with Diflucan 150 mg tablet x 1.  Repeat with second dose in 72 hours if symptoms persist. - WET PREP FOR TRICH, YEAST, CLUE - POCT Urinalysis Dipstick - Chlamydia/Gonococcus/Trichomonas, NAA   Procedures performed this visit: None.  Return if symptoms worsen or fail to improve.  __________________________________ Thayer Ohm, DNP, APRN, FNP-BC Primary Care and Sports Medicine [facility] [place]

## 2023-09-17 LAB — WET PREP FOR TRICH, YEAST, CLUE
Clue Cell Exam: NEGATIVE
Trichomonas Exam: NEGATIVE
Yeast Exam: NEGATIVE

## 2023-09-18 LAB — CHLAMYDIA/GONOCOCCUS/TRICHOMONAS, NAA
Chlamydia by NAA: NEGATIVE
Gonococcus by NAA: NEGATIVE
Trich vag by NAA: NEGATIVE

## 2023-10-03 DIAGNOSIS — S299XXA Unspecified injury of thorax, initial encounter: Secondary | ICD-10-CM | POA: Diagnosis not present

## 2023-10-03 DIAGNOSIS — M51379 Other intervertebral disc degeneration, lumbosacral region without mention of lumbar back pain or lower extremity pain: Secondary | ICD-10-CM | POA: Diagnosis not present

## 2023-10-03 DIAGNOSIS — S20212A Contusion of left front wall of thorax, initial encounter: Secondary | ICD-10-CM | POA: Diagnosis not present

## 2023-10-03 DIAGNOSIS — S2020XA Contusion of thorax, unspecified, initial encounter: Secondary | ICD-10-CM | POA: Diagnosis not present

## 2023-10-03 DIAGNOSIS — M545 Low back pain, unspecified: Secondary | ICD-10-CM | POA: Diagnosis not present

## 2023-10-03 DIAGNOSIS — R296 Repeated falls: Secondary | ICD-10-CM | POA: Diagnosis not present

## 2023-10-03 DIAGNOSIS — W000XXA Fall on same level due to ice and snow, initial encounter: Secondary | ICD-10-CM | POA: Diagnosis not present

## 2023-10-03 DIAGNOSIS — S39012A Strain of muscle, fascia and tendon of lower back, initial encounter: Secondary | ICD-10-CM | POA: Diagnosis not present

## 2023-10-24 ENCOUNTER — Encounter: Payer: Self-pay | Admitting: Family Medicine

## 2023-10-24 ENCOUNTER — Ambulatory Visit (INDEPENDENT_AMBULATORY_CARE_PROVIDER_SITE_OTHER): Payer: BC Managed Care – PPO | Admitting: Family Medicine

## 2023-10-24 VITALS — BP 130/94 | HR 80 | Ht 69.0 in | Wt 270.0 lb

## 2023-10-24 DIAGNOSIS — R030 Elevated blood-pressure reading, without diagnosis of hypertension: Secondary | ICD-10-CM | POA: Insufficient documentation

## 2023-10-24 DIAGNOSIS — N898 Other specified noninflammatory disorders of vagina: Secondary | ICD-10-CM | POA: Diagnosis not present

## 2023-10-24 DIAGNOSIS — Z113 Encounter for screening for infections with a predominantly sexual mode of transmission: Secondary | ICD-10-CM

## 2023-10-24 DIAGNOSIS — Z1159 Encounter for screening for other viral diseases: Secondary | ICD-10-CM | POA: Diagnosis not present

## 2023-10-24 DIAGNOSIS — Z6839 Body mass index (BMI) 39.0-39.9, adult: Secondary | ICD-10-CM

## 2023-10-24 NOTE — Assessment & Plan Note (Signed)
Blood pressure was high at recent ED visit but she was having back pain today she is not in pain and her blood pressure still elevated we will recheck it before she goes home today.  Discussed working on healthy food choices and diet recommended the DASH diet and also work on work regular exercise.  Studies have shown reduction in blood pressure with routine exercise.  Plan to follow-up in 2 to 3 months to see if improving if still elevated.

## 2023-10-24 NOTE — Progress Notes (Signed)
   Acute Office Visit  Subjective:     Patient ID: Megan Grant, female    DOB: November 08, 1995, 28 y.o.   MRN: [id number]  Chief Complaint  Patient presents with   sti testing    HPI Patient is in today for STI testing.  Found out that her partner was cheating.    BP was also high recently elevated when she fell when we had the ice and went to an ED at [facility] on 1/13  after she injured her back.  ROS      Objective:    BP (!) 130/94   Pulse 80   Ht 5\' 9"  (1.753 m)   Wt 270 lb (122.5 kg)   SpO2 100%   BMI 39.87 kg/m    Physical Exam Vitals and nursing note reviewed.  Constitutional:      Appearance: Normal appearance.  HENT:     Head: Normocephalic and atraumatic.  Eyes:     Conjunctiva/sclera: Conjunctivae normal.  Cardiovascular:     Rate and Rhythm: Normal rate and regular rhythm.  Pulmonary:     Effort: Pulmonary effort is normal.     Breath sounds: Normal breath sounds.  Skin:    General: Skin is warm and dry.  Neurological:     Mental Status: She is alert.  Psychiatric:        Mood and Affect: Mood normal.     No results found for any visits on 10/24/23.      Assessment & Plan:   Problem List Items Addressed This Visit       Other   Elevated BP without diagnosis of hypertension   Blood pressure was high at recent ED visit but she was having back pain today she is not in pain and her blood pressure still elevated we will recheck it before she goes home today.  Discussed working on healthy food choices and diet recommended the DASH diet and also work on work regular exercise.  Studies have shown reduction in blood pressure with routine exercise.  Plan to follow-up in 2 to 3 months to see if improving if still elevated.      Other Visit Diagnoses       Screening examination for STI    -  Primary   Relevant Orders   STI Profile   HIV antibody (with reflex)   RPR   Hepatitis C Antibody       No orders of the defined types were placed in  this encounter.   Return in about 2 months (around 12/22/2023) for blood pressure if still high .  Nani Gasser, MD

## 2023-10-26 ENCOUNTER — Encounter: Payer: Self-pay | Admitting: Family Medicine

## 2023-10-26 LAB — STI PROFILE
HCV Ab: NONREACTIVE
Hep B Core Total Ab: NEGATIVE
Hep B Surface Ab, Qual: NONREACTIVE
Hepatitis B Surface Ag: NEGATIVE
RPR Ser Ql: NONREACTIVE

## 2023-10-26 LAB — HCV INTERPRETATION

## 2023-10-26 LAB — HEPATITIS C ANTIBODY

## 2023-10-28 ENCOUNTER — Encounter: Payer: Self-pay | Admitting: Medical-Surgical

## 2023-10-28 ENCOUNTER — Other Ambulatory Visit: Payer: Self-pay | Admitting: Medical-Surgical

## 2023-10-28 ENCOUNTER — Telehealth: Payer: Self-pay

## 2023-10-28 LAB — CHLAMYDIA/GONOCOCCUS/TRICHOMONAS, NAA
Chlamydia by NAA: NEGATIVE
Gonococcus by NAA: NEGATIVE
Trich vag by NAA: POSITIVE — AB

## 2023-10-28 LAB — SPECIMEN STATUS REPORT

## 2023-10-28 MED ORDER — METRONIDAZOLE 500 MG PO TABS: 500.0000 mg | ORAL_TABLET | Freq: Two times a day (BID) | ORAL | 0 refills | Status: AC

## 2023-10-28 NOTE — Telephone Encounter (Signed)
 Copied from CRM [id number]. Topic: Clinical - Lab/Test Results >> Oct 28, 2023 11:01 AM Joesph PARAS wrote: Reason for CRM: Patient calling in panicked because she sees a test result that she is positive for an STI. Patient is extremely concerned and would like a nurse to call her back ASAP.

## 2023-10-31 ENCOUNTER — Telehealth: Payer: Self-pay

## 2023-10-31 NOTE — Telephone Encounter (Signed)
 Copied from CRM [id number]. Topic: Referral - Question >> Oct 31, 2023  2:29 PM Carlatta H wrote: Reason for CRM: Britany from [facility] needs Referral for weight management needs to be fax to [phone]

## 2023-11-04 NOTE — Telephone Encounter (Addendum)
Pt reviewed these results

## 2023-11-23 DIAGNOSIS — E66813 Obesity, class 3: Secondary | ICD-10-CM | POA: Diagnosis not present

## 2023-11-23 DIAGNOSIS — Z13228 Encounter for screening for other metabolic disorders: Secondary | ICD-10-CM | POA: Diagnosis not present

## 2023-11-23 DIAGNOSIS — Z6839 Body mass index (BMI) 39.0-39.9, adult: Secondary | ICD-10-CM | POA: Diagnosis not present

## 2023-11-23 DIAGNOSIS — Z6841 Body Mass Index (BMI) 40.0 and over, adult: Secondary | ICD-10-CM | POA: Diagnosis not present

## 2023-11-23 DIAGNOSIS — Z136 Encounter for screening for cardiovascular disorders: Secondary | ICD-10-CM | POA: Diagnosis not present

## 2023-11-23 DIAGNOSIS — Z713 Dietary counseling and surveillance: Secondary | ICD-10-CM | POA: Diagnosis not present

## 2023-11-23 DIAGNOSIS — Z113 Encounter for screening for infections with a predominantly sexual mode of transmission: Secondary | ICD-10-CM | POA: Diagnosis not present

## 2023-11-23 DIAGNOSIS — E66812 Obesity, class 2: Secondary | ICD-10-CM | POA: Diagnosis not present

## 2023-11-23 DIAGNOSIS — Z0000 Encounter for general adult medical examination without abnormal findings: Secondary | ICD-10-CM | POA: Diagnosis not present

## 2023-11-23 DIAGNOSIS — Z7182 Exercise counseling: Secondary | ICD-10-CM | POA: Diagnosis not present

## 2023-11-23 DIAGNOSIS — Z1331 Encounter for screening for depression: Secondary | ICD-10-CM | POA: Diagnosis not present

## 2023-11-23 DIAGNOSIS — M5441 Lumbago with sciatica, right side: Secondary | ICD-10-CM | POA: Diagnosis not present

## 2023-11-25 ENCOUNTER — Telehealth: Payer: Self-pay

## 2023-11-25 DIAGNOSIS — I10 Essential (primary) hypertension: Secondary | ICD-10-CM | POA: Diagnosis not present

## 2023-11-25 DIAGNOSIS — H538 Other visual disturbances: Secondary | ICD-10-CM | POA: Diagnosis not present

## 2023-11-25 DIAGNOSIS — G441 Vascular headache, not elsewhere classified: Secondary | ICD-10-CM | POA: Diagnosis not present

## 2023-11-25 DIAGNOSIS — Z79899 Other long term (current) drug therapy: Secondary | ICD-10-CM | POA: Diagnosis not present

## 2023-11-25 DIAGNOSIS — R42 Dizziness and giddiness: Secondary | ICD-10-CM | POA: Diagnosis not present

## 2023-11-25 DIAGNOSIS — R519 Headache, unspecified: Secondary | ICD-10-CM | POA: Diagnosis not present

## 2023-11-25 NOTE — Telephone Encounter (Signed)
 Forwarding message to DR. Thekkekandam covering Dr. Linford Arnold Am I o.k. to schedule patient at DR. Metheney's return for this as she is booked until mid April ?

## 2023-11-25 NOTE — Telephone Encounter (Signed)
 Copied from CRM [id number]. Topic: Clinical - Medical Advice >> Nov 25, 2023  8:06 AM Gaetano Hawthorne wrote: Reason for CRM: Patient recently visited a weight loss clinic - they drew some labs and mentioned that she has some symptoms that could be considered hyperthyroidism - she was advised to reach out to PCP for next steps. Please call patient when you have a moment for additional guidance.

## 2023-11-25 NOTE — Telephone Encounter (Signed)
 Put her in 1 of [facility] same-day slots.,  She has some in a week and a half or 2 weeks.

## 2023-11-28 NOTE — Telephone Encounter (Signed)
 Attempted call to patient. Mail box is full. Could not leave a voice mail message.

## 2023-11-29 NOTE — Telephone Encounter (Signed)
 Just FYI_ Patient was seen in hospital- high BP started on BP medication. She does not remember the name of the BP medication.  Pateint has upcoming appt In April with DR. Metheney.

## 2023-12-04 NOTE — Addendum Note (Signed)
 Addended by: Nani Gasser D on: 12/04/2023 08:57 AM   Modules accepted: Orders

## 2023-12-14 DIAGNOSIS — J029 Acute pharyngitis, unspecified: Secondary | ICD-10-CM | POA: Diagnosis not present

## 2023-12-14 DIAGNOSIS — R590 Localized enlarged lymph nodes: Secondary | ICD-10-CM | POA: Diagnosis not present

## 2023-12-14 DIAGNOSIS — R030 Elevated blood-pressure reading, without diagnosis of hypertension: Secondary | ICD-10-CM | POA: Diagnosis not present

## 2023-12-21 DIAGNOSIS — I10 Essential (primary) hypertension: Secondary | ICD-10-CM | POA: Diagnosis not present

## 2023-12-21 DIAGNOSIS — Z7182 Exercise counseling: Secondary | ICD-10-CM | POA: Diagnosis not present

## 2023-12-21 DIAGNOSIS — Z6835 Body mass index (BMI) 35.0-35.9, adult: Secondary | ICD-10-CM | POA: Diagnosis not present

## 2023-12-21 DIAGNOSIS — Z713 Dietary counseling and surveillance: Secondary | ICD-10-CM | POA: Diagnosis not present

## 2023-12-21 DIAGNOSIS — E66812 Obesity, class 2: Secondary | ICD-10-CM | POA: Diagnosis not present

## 2023-12-21 DIAGNOSIS — R632 Polyphagia: Secondary | ICD-10-CM | POA: Diagnosis not present

## 2023-12-22 ENCOUNTER — Ambulatory Visit (INDEPENDENT_AMBULATORY_CARE_PROVIDER_SITE_OTHER): Payer: BC Managed Care – PPO | Admitting: Family Medicine

## 2023-12-22 ENCOUNTER — Encounter: Payer: Self-pay | Admitting: Family Medicine

## 2023-12-22 VITALS — BP 119/86 | HR 73 | Ht 69.0 in | Wt 257.0 lb

## 2023-12-22 DIAGNOSIS — R7989 Other specified abnormal findings of blood chemistry: Secondary | ICD-10-CM

## 2023-12-22 DIAGNOSIS — R030 Elevated blood-pressure reading, without diagnosis of hypertension: Secondary | ICD-10-CM | POA: Diagnosis not present

## 2023-12-22 DIAGNOSIS — N898 Other specified noninflammatory disorders of vagina: Secondary | ICD-10-CM | POA: Diagnosis not present

## 2023-12-22 NOTE — Assessment & Plan Note (Signed)
 Blood pressure overall looks good today.  Diastolic just a little borderline so we will keep an eye on that she is on phentermine 10 Topamax to assist with weight loss this could be contributing.  We did discuss that the phentermine can raise blood pressure.  But they are monitoring her carefully at her visits and those blood pressures have looked fantastic.

## 2023-12-22 NOTE — Progress Notes (Signed)
 Acute Office Visit  Subjective:     Patient ID: Megan Grant, female    DOB: Sep 08, 1996, 28 y.o.   MRN: [id number]  Chief Complaint  Patient presents with   Sinusitis    HTN    HPI Patient is in today for follow-up from recent emergency department visit.  She was experiencing headache, dizziness,blurry vision and uncontrolled hypertension.  Pressure was around 160.  Interestingly at a previous recent visit her systolic pressure was 110.  They did do some lab workup CBC BMP and magnesium.  Her serum creatinine was just slightly elevated at 1.05.  They started her on losartan 50 mg and encouraged her to follow-up.  Work has been stressful to the point that she is even considering trying to find a new job.  She wonders if part of her stress could have triggered some of her symptoms.  Is feeling better and has not had any symptoms since then.  Started Qsymia maybe 6 weeks ago.  She is followed at [facility].  She has noticed some vaginal itching for about 1-1/2 weeks.  No discharge.  She says just gotten uncomfortable she is not sure if it could be a soap or detergent but she has not noticed a rash per se. No new sex partners.    ROS      Objective:    BP 119/86 (BP Location: Left Arm, Patient Position: Sitting, Cuff Size: Large)   Pulse 73   Ht 5\' 9"  (1.753 m)   Wt 257 lb (116.6 kg)   SpO2 97%   BMI 37.95 kg/m    Physical Exam Vitals and nursing note reviewed.  Constitutional:      Appearance: Normal appearance.  HENT:     Head: Normocephalic and atraumatic.  Eyes:     Conjunctiva/sclera: Conjunctivae normal.  Cardiovascular:     Rate and Rhythm: Normal rate and regular rhythm.  Pulmonary:     Effort: Pulmonary effort is normal.     Breath sounds: Normal breath sounds.  Skin:    General: Skin is warm and dry.  Neurological:     Mental Status: She is alert.  Psychiatric:        Mood and Affect: Mood normal.     No results found for any visits on  12/22/23.      Assessment & Plan:   Problem List Items Addressed This Visit       Other   Elevated BP without diagnosis of hypertension   Blood pressure overall looks good today.  Diastolic just a little borderline so we will keep an eye on that she is on phentermine 10 Topamax to assist with weight loss this could be contributing.  We did discuss that the phentermine can raise blood pressure.  But they are monitoring her carefully at her visits and those blood pressures have looked fantastic.      Other Visit Diagnoses       Elevated serum creatinine    -  Primary   Relevant Orders   Basic Metabolic Panel (BMET)     Vaginal itching       Relevant Orders   WET PREP FOR TRICH, YEAST, CLUE      Vaginitis -could be yeast infections we will have her do a swab today.  Also consider chemical irritation so also take a look at detergents fabric softeners etc.  Labs in the ED showed a slightly elevated serum creatinine at 1.05 so I would like to repeat that  today.  No orders of the defined types were placed in this encounter.   No follow-ups on file.  Nani Gasser, MD

## 2023-12-23 ENCOUNTER — Encounter: Payer: Self-pay | Admitting: Family Medicine

## 2023-12-23 LAB — BASIC METABOLIC PANEL WITH GFR
BUN/Creatinine Ratio: 10 (ref 9–23)
BUN: 9 mg/dL (ref 6–20)
CO2: 21 mmol/L (ref 20–29)
Calcium: 8.9 mg/dL (ref 8.7–10.2)
Chloride: 106 mmol/L (ref 96–106)
Creatinine, Ser: 0.93 mg/dL (ref 0.57–1.00)
Glucose: 78 mg/dL (ref 70–99)
Potassium: 4.3 mmol/L (ref 3.5–5.2)
Sodium: 141 mmol/L (ref 134–144)
eGFR: 85 mL/min/{1.73_m2} (ref >59)

## 2023-12-23 LAB — WET PREP FOR TRICH, YEAST, CLUE
Clue Cell Exam: POSITIVE — AB
Trichomonas Exam: NEGATIVE
Yeast Exam: POSITIVE — AB

## 2023-12-23 MED ORDER — FLUCONAZOLE 150 MG PO TABS: 150.0000 mg | ORAL_TABLET | Freq: Once | ORAL | 0 refills | Status: AC

## 2023-12-23 MED ORDER — METRONIDAZOLE 0.75 % VA GEL
1.0000 | Freq: Two times a day (BID) | VAGINAL | 0 refills | Status: AC
Start: 2023-12-23 — End: 2023-12-28

## 2023-12-23 NOTE — Addendum Note (Signed)
 Addended by: Nani Gasser D on: 12/23/2023 05:12 PM   Modules accepted: Orders

## 2023-12-23 NOTE — Progress Notes (Signed)
 Your lab work is within acceptable range and there are no concerning findings.   ?

## 2023-12-23 NOTE — Progress Notes (Signed)
 Hi Megan Grant, your wet prep came back positive for yeast and for bacterial vaginitis I am going to send over a prescription to the pharmacy for you to pick up for Diflucan for the yeast infection and I am going to have you do the metronidazole gel vaginally to treat the BV.  Please let me know if you have any problems or concerns or if you feel like you are still having symptoms.  Orders Placed This Encounter     fluconazole (DIFLUCAN) 150 MG tablet         Sig: Take 1 tablet (150 mg total) by mouth once for 1 dose.         Dispense:  1 tablet         Refill:  0     metroNIDAZOLE (METROGEL) 0.75 % vaginal gel         Sig: Place 1 Applicatorful vaginally 2 (two) times daily for 5 days.         Dispense:  100 g         Refill:  0

## 2024-01-02 ENCOUNTER — Ambulatory Visit: Admitting: Family Medicine

## 2024-02-01 DIAGNOSIS — E66812 Obesity, class 2: Secondary | ICD-10-CM | POA: Diagnosis not present

## 2024-02-01 DIAGNOSIS — Z713 Dietary counseling and surveillance: Secondary | ICD-10-CM | POA: Diagnosis not present

## 2024-02-01 DIAGNOSIS — E639 Nutritional deficiency, unspecified: Secondary | ICD-10-CM | POA: Diagnosis not present

## 2024-02-01 DIAGNOSIS — R632 Polyphagia: Secondary | ICD-10-CM | POA: Diagnosis not present

## 2024-02-01 DIAGNOSIS — Z6835 Body mass index (BMI) 35.0-35.9, adult: Secondary | ICD-10-CM | POA: Diagnosis not present

## 2024-02-01 DIAGNOSIS — I10 Essential (primary) hypertension: Secondary | ICD-10-CM | POA: Diagnosis not present

## 2024-02-21 DIAGNOSIS — R300 Dysuria: Secondary | ICD-10-CM | POA: Diagnosis not present

## 2024-02-22 ENCOUNTER — Encounter: Payer: Self-pay | Admitting: Physician Assistant

## 2024-02-22 ENCOUNTER — Ambulatory Visit (INDEPENDENT_AMBULATORY_CARE_PROVIDER_SITE_OTHER): Admitting: Physician Assistant

## 2024-02-22 VITALS — BP 136/100 | HR 95 | Temp 98.6°F | Ht 69.0 in | Wt 247.0 lb

## 2024-02-22 DIAGNOSIS — R300 Dysuria: Secondary | ICD-10-CM

## 2024-02-22 DIAGNOSIS — N898 Other specified noninflammatory disorders of vagina: Secondary | ICD-10-CM

## 2024-02-22 LAB — POCT URINALYSIS DIP (CLINITEK)
Bilirubin, UA: NEGATIVE
Blood, UA: NEGATIVE
Glucose, UA: NEGATIVE mg/dL
Nitrite, UA: NEGATIVE
POC PROTEIN,UA: NEGATIVE
Spec Grav, UA: >=1.03 — AB (ref 1.010–1.025)
Urobilinogen, UA: 0.2 U/dL
pH, UA: 5.5 (ref 5.0–8.0)

## 2024-02-22 MED ORDER — FLUCONAZOLE 150 MG PO TABS: 150.0000 mg | ORAL_TABLET | Freq: Once | ORAL | 0 refills | Status: AC

## 2024-02-22 NOTE — Progress Notes (Unsigned)
 Acute Office Visit  Subjective:     Patient ID: Megan Grant, female    DOB: [birth year], 28 y.o.   MRN: [id number]  Chief Complaint  Patient presents with   Medical Management of Chronic Issues    HPI Patient is in today for vaginal itching that started 3 days ago and progressed into some dysuria. Not tried anything to make better. Hx of yeast and BV infection. Denies any fever, chills, flank pain, abdominal pain, nausea, vomiting, discharge. She is sexually active with one partner.   ROS See HPI.      Objective:     BP 134/88   Pulse 95   Temp 98.6 F (37 C) (Oral)   Ht 5\' 9"  (1.753 m)   Wt 247 lb (112 kg)   SpO2 99%   BMI 36.48 kg/m  BP Readings from Last 3 Encounters:  02/22/24 134/88  12/22/23 119/86  10/24/23 (!) 130/94   Wt Readings from Last 3 Encounters:  02/22/24 247 lb (112 kg)  12/22/23 257 lb (116.6 kg)  10/24/23 270 lb (122.5 kg)      Physical Exam Constitutional:      Appearance: Normal appearance. She is obese.  HENT:     Head: Normocephalic.  Cardiovascular:     Rate and Rhythm: Normal rate and regular rhythm.  Pulmonary:     Effort: Pulmonary effort is normal.     Breath sounds: Normal breath sounds.  Abdominal:     General: There is no distension.     Palpations: Abdomen is soft. There is no mass.     Tenderness: There is no abdominal tenderness. There is no right CVA tenderness, left CVA tenderness or guarding.  Musculoskeletal:     Right lower leg: No edema.     Left lower leg: No edema.  Neurological:     Mental Status: She is alert.  Psychiatric:        Mood and Affect: Mood normal.     Results for orders placed or performed in visit on 02/22/24  Urine Culture   Specimen: Urine   Urine  Result Value Ref Range   Urine Culture, Routine Final report    Organism ID, Bacteria Comment   POCT URINALYSIS DIP (CLINITEK)  Result Value Ref Range   Color, UA yellow yellow   Clarity, UA clear clear   Glucose, UA negative  negative mg/dL   Bilirubin, UA negative negative   Ketones, POC UA trace (5) (A) negative mg/dL   Spec Grav, UA >=7.829 (A) 1.010 - 1.025   Blood, UA negative negative   pH, UA 5.5 5.0 - 8.0   POC PROTEIN,UA negative negative, trace   Urobilinogen, UA 0.2 0.2 or 1.0 E.U./dL   Nitrite, UA Negative Negative   Leukocytes, UA Trace (A) Negative        Assessment & Plan:  Aaron AasAaron AasSreshta was seen today for medical management of chronic issues.  Diagnoses and all orders for this visit:  Dysuria -     POCT URINALYSIS DIP (CLINITEK) -     Urine Culture -     NuSwab Vaginitis Plus (VG+)  Vaginal itching -     NuSwab Vaginitis Plus (VG+) -     fluconazole  (DIFLUCAN ) 150 MG tablet; Take 1 tablet (150 mg total) by mouth once for 1 dose. Repeat in 48 to 72 hours if symptoms persist.   UA with leukocytes.  Will culture Wet prep and STD culture ordered Start with diflucan  x2 Will add to  or change plan as needed No red flags today  Sandy Crumb, PA-C

## 2024-02-24 ENCOUNTER — Ambulatory Visit: Payer: Self-pay | Admitting: Physician Assistant

## 2024-02-24 LAB — URINE CULTURE

## 2024-02-24 NOTE — Progress Notes (Signed)
 No significant bacteria detected in urine. Still waiting for culture.

## 2024-02-27 LAB — NUSWAB VAGINITIS PLUS (VG+)
Chlamydia trachomatis, NAA: NEGATIVE
Neisseria gonorrhoeae, NAA: NEGATIVE
Trich vag by NAA: NEGATIVE

## 2024-02-27 MED ORDER — FLUCONAZOLE 150 MG PO TABS: ORAL_TABLET | ORAL | 0 refills | Status: DC

## 2024-02-27 MED ORDER — METRONIDAZOLE 500 MG PO TABS: 500.0000 mg | ORAL_TABLET | Freq: Two times a day (BID) | ORAL | 0 refills | Status: DC

## 2024-02-27 NOTE — Progress Notes (Signed)
 Metronidazole  sent for bacteria vaginosis.  Negative for STDs.  Still waiting for candida labs to come back.

## 2024-02-27 NOTE — Progress Notes (Signed)
 Yeast was detected as well. Take one diflucan  now and then one at end of metronidazole  treatment.

## 2024-03-14 DIAGNOSIS — E66811 Obesity, class 1: Secondary | ICD-10-CM | POA: Diagnosis not present

## 2024-03-14 DIAGNOSIS — E6609 Other obesity due to excess calories: Secondary | ICD-10-CM | POA: Diagnosis not present

## 2024-03-14 DIAGNOSIS — I10 Essential (primary) hypertension: Secondary | ICD-10-CM | POA: Diagnosis not present

## 2024-03-14 DIAGNOSIS — Z6833 Body mass index (BMI) 33.0-33.9, adult: Secondary | ICD-10-CM | POA: Diagnosis not present

## 2024-03-14 DIAGNOSIS — E639 Nutritional deficiency, unspecified: Secondary | ICD-10-CM | POA: Diagnosis not present

## 2024-03-27 ENCOUNTER — Encounter: Payer: Self-pay | Admitting: Physician Assistant

## 2024-03-27 ENCOUNTER — Ambulatory Visit (INDEPENDENT_AMBULATORY_CARE_PROVIDER_SITE_OTHER): Admitting: Physician Assistant

## 2024-03-27 VITALS — BP 123/90 | HR 87 | Ht 69.0 in | Wt 237.8 lb

## 2024-03-27 DIAGNOSIS — Z113 Encounter for screening for infections with a predominantly sexual mode of transmission: Secondary | ICD-10-CM | POA: Diagnosis not present

## 2024-03-27 DIAGNOSIS — B3731 Acute candidiasis of vulva and vagina: Secondary | ICD-10-CM

## 2024-03-27 MED ORDER — FLUCONAZOLE 150 MG PO TABS: ORAL_TABLET | ORAL | 0 refills | Status: AC

## 2024-03-27 NOTE — Patient Instructions (Addendum)
 Try OTC boric acid suppositories twice a week for yeast/BV prevention.   Vaginal Yeast Infection, Adult  Vaginal yeast infection is a condition that causes vaginal discharge as well as soreness, swelling, and redness (inflammation) of the vagina. This is a common condition. Some women get this infection frequently. What are the causes? This condition is caused by a change in the normal balance of the yeast (Candida) and normal bacteria that live in the vagina. This change causes an overgrowth of yeast, which causes the inflammation. What increases the risk? The condition is more likely to develop in women who: Take antibiotic medicines. Have diabetes. Take birth control pills. Are pregnant. Douche often. Have a weak body defense system (immune system). Have been taking steroid medicines for a long time. Frequently wear tight clothing. What are the signs or symptoms? Symptoms of this condition include: White, thick, creamy vaginal discharge. Swelling, itching, redness, and irritation of the vagina. The lips of the vagina (labia) may be affected as well. Pain or a burning feeling while urinating. Pain during sex. How is this diagnosed? This condition is diagnosed based on: Your medical history. A physical exam. A pelvic exam. Your health care provider will examine a sample of your vaginal discharge under a microscope. Your health care provider may send this sample for testing to confirm the diagnosis. How is this treated? This condition is treated with medicine. Medicines may be over-the-counter or prescription. You may be told to use one or more of the following: Medicine that is taken by mouth (orally). Medicine that is applied as a cream (topically). Medicine that is inserted directly into the vagina (suppository). Follow these instructions at home: Take or apply over-the-counter and prescription medicines only as told by your health care provider. Do not use tampons until your  health care provider approves. Do not have sex until your infection has cleared. Sex can prolong or worsen your symptoms of infection. Ask your health care provider when it is safe to resume sexual activity. Keep all follow-up visits. This is important. How is this prevented?  Do not wear tight clothes, such as pantyhose or tight pants. Wear breathable cotton underwear. Do not use douches, perfumed soap, creams, or powders. Wipe from front to back after using the toilet. If you have diabetes, keep your blood sugar levels under control. Ask your health care provider for other ways to prevent yeast infections. Contact a health care provider if: You have a fever. Your symptoms go away and then return. Your symptoms do not get better with treatment. Your symptoms get worse. You have new symptoms. You develop blisters in or around your vagina. You have blood coming from your vagina and it is not your menstrual period. You develop pain in your abdomen. Summary Vaginal yeast infection is a condition that causes discharge as well as soreness, swelling, and redness (inflammation) of the vagina. This condition is treated with medicine. Medicines may be over-the-counter or prescription. Take or apply over-the-counter and prescription medicines only as told by your health care provider. Do not douche. Resume sexual activity or use of tampons as instructed by your health care provider. Contact a health care provider if your symptoms do not get better with treatment or your symptoms go away and then return. This information is not intended to replace advice given to you by your health care provider. Make sure you discuss any questions you have with your health care provider. Document Revised: 11/24/2020 Document Reviewed: 11/24/2020 [employer] Patient Education  2024 [employer]

## 2024-03-27 NOTE — Progress Notes (Signed)
   Established Patient Office Visit  Subjective   Patient ID: Megan Grant, female    DOB: 1996-05-29  Age: 28 y.o. MRN: [id number]  Chief Complaint  Patient presents with   Vaginal Discharge    Thick white vaginal discharge, vaginal itching and vaginal burning  x Thursday 03/21/2024 or Friday 03/22/2024.  Patient just found out partner has been  unfaithful - requesting STI checking .     HPI Pt is a 28 yo female who presents to the clinic with vaginal white discharge and irritation. She had yeast infection with no clue cells or STD on 6/4 confirmed with nuswab testing. She took diflucan  and symptoms resolved. They recently started coming back. Pt also found out that her husband has been unfaithful to her. She would like STD testing. Patient denies any fever, chills, dysuria, abdominal pain.    ROS See HPI.    Objective:     BP (!) 123/90   Pulse 87   Ht 5' 9 (1.753 m)   Wt 237 lb 12 oz (107.8 kg)   SpO2 99%   BMI 35.11 kg/m  BP Readings from Last 3 Encounters:  03/27/24 (!) 123/90  02/22/24 134/88  12/22/23 119/86   Wt Readings from Last 3 Encounters:  03/27/24 237 lb 12 oz (107.8 kg)  02/22/24 247 lb (112 kg)  12/22/23 257 lb (116.6 kg)      Physical Exam Constitutional:      Appearance: Normal appearance. She is obese.  Cardiovascular:     Rate and Rhythm: Normal rate.  Pulmonary:     Effort: Pulmonary effort is normal.  Neurological:     Mental Status: She is alert and oriented to person, place, and time.  Psychiatric:        Mood and Affect: Mood normal.         Assessment & Plan:  SABRASABRASonoma was seen today for vaginal discharge.  Diagnoses and all orders for this visit:  Yeast vaginitis -     fluconazole  (DIFLUCAN ) 150 MG tablet; Take one tablet now and then one tablet in 48 hours.  Screening examination for STD (sexually transmitted disease)   Symptoms sound consistent with yeast infection Start treatment with Diflucan  x2 doses Discussed  prevention with boric acid suppositories twice a week HO given on yeast prevention Will order STD screening Follow up as needed   Return if symptoms worsen or fail to improve.    Taraoluwa Thakur, PA-C

## 2024-03-27 NOTE — Addendum Note (Signed)
 Addended by: Lainee Lehrman P on: 03/27/2024 03:06 PM   Modules accepted: Orders

## 2024-03-30 ENCOUNTER — Ambulatory Visit: Payer: Self-pay | Admitting: Physician Assistant

## 2024-03-30 MED ORDER — METRONIDAZOLE 500 MG PO TABS
500.0000 mg | ORAL_TABLET | Freq: Two times a day (BID) | ORAL | 0 refills | Status: AC
Start: 2024-03-30 — End: ?

## 2024-03-30 NOTE — Progress Notes (Signed)
 STD panel pending.  You have bacterial vaginosis.  Yeast infection has cleared.  Start metronidazole , sent to pharmacy.

## 2024-03-31 LAB — NUSWAB VAGINITIS PLUS (VG+)
Atopobium vaginae: HIGH {score} — AB
BVAB 2: HIGH {score} — AB
Candida albicans, NAA: NEGATIVE
Candida glabrata, NAA: NEGATIVE
Megasphaera 1: HIGH {score} — AB

## 2024-04-02 NOTE — Progress Notes (Signed)
 STD panel was negative.
